# Patient Record
Sex: Female | Born: 1970 | Race: White | Hispanic: No | Marital: Married | Smoking: Never smoker
Health system: Southern US, Community
[De-identification: ages and names within clinical notes are randomized; demographics above are authoritative.]

## PROBLEM LIST (undated history)

## (undated) DIAGNOSIS — G35D Multiple sclerosis, unspecified: Secondary | ICD-10-CM

## (undated) DIAGNOSIS — F32A Depression, unspecified: Secondary | ICD-10-CM

## (undated) DIAGNOSIS — F419 Anxiety disorder, unspecified: Secondary | ICD-10-CM

## (undated) DIAGNOSIS — I4891 Unspecified atrial fibrillation: Secondary | ICD-10-CM

## (undated) HISTORY — DX: Anxiety disorder, unspecified: F41.9

## (undated) HISTORY — DX: Depression, unspecified: F32.A

---

## 2020-07-23 DIAGNOSIS — I4891 Unspecified atrial fibrillation: Secondary | ICD-10-CM | POA: Insufficient documentation

## 2020-07-23 DIAGNOSIS — F419 Anxiety disorder, unspecified: Secondary | ICD-10-CM | POA: Insufficient documentation

## 2020-07-23 LAB — HM HIV SCREENING LAB: HM HIV Screening: NEGATIVE

## 2020-07-23 LAB — HM HEPATITIS C SCREENING LAB: HM Hepatitis Screen: NEGATIVE

## 2020-07-24 DIAGNOSIS — R7303 Prediabetes: Secondary | ICD-10-CM | POA: Insufficient documentation

## 2022-02-20 ENCOUNTER — Ambulatory Visit
Admission: EM | Admit: 2022-02-20 | Discharge: 2022-02-20 | Disposition: A | Discharge status: Home / Self Care | Payer: BC Managed Care – PPO

## 2022-02-20 ENCOUNTER — Ambulatory Visit (INDEPENDENT_AMBULATORY_CARE_PROVIDER_SITE_OTHER): Payer: BC Managed Care – PPO

## 2022-02-20 DIAGNOSIS — R509 Fever, unspecified: Secondary | ICD-10-CM

## 2022-02-20 DIAGNOSIS — R0989 Other specified symptoms and signs involving the circulatory and respiratory systems: Secondary | ICD-10-CM | POA: Diagnosis not present

## 2022-02-20 DIAGNOSIS — J22 Unspecified acute lower respiratory infection: Secondary | ICD-10-CM | POA: Diagnosis not present

## 2022-02-20 DIAGNOSIS — R059 Cough, unspecified: Secondary | ICD-10-CM | POA: Diagnosis not present

## 2022-02-20 MED ORDER — AMOXICILLIN-POT CLAVULANATE 875-125 MG PO TABS: 1.0000 | ORAL_TABLET | Freq: Two times a day (BID) | ORAL | 0 refills | Status: AC

## 2022-02-20 MED ORDER — IBUPROFEN 400 MG PO TABS: 400.0000 mg | ORAL_TABLET | Freq: Three times a day (TID) | ORAL | 0 refills | Status: DC | PRN

## 2022-02-20 MED ORDER — ALBUTEROL SULFATE HFA 108 (90 BASE) MCG/ACT IN AERS: 2.0000 | INHALATION_SPRAY | Freq: Four times a day (QID) | RESPIRATORY_TRACT | 0 refills | Status: DC | PRN

## 2022-02-20 MED ORDER — FLUTICASONE PROPIONATE 50 MCG/ACT NA SUSP: 1.0000 | Freq: Every day | NASAL | 2 refills | Status: DC

## 2022-02-20 MED ORDER — CETIRIZINE HCL 10 MG PO TABS: 10.0000 mg | ORAL_TABLET | Freq: Every day | ORAL | 2 refills | Status: DC

## 2022-02-20 NOTE — ED Triage Notes (Signed)
Pt c/o cough, post nasal drip, ears feel clogged, and nasal congestion (started today).   Started: Sunday  Home interventions: Mucinex, cough drops

## 2022-02-20 NOTE — ED Provider Notes (Signed)
[facility]    CSN: [id number] Arrival date & time: 02/20/22  1751    HISTORY   Chief Complaint  Patient presents with   Cough   Nasal Congestion   Ear Fullness   HPI Erika Mercer is a 51 y.o. female. Patient presents to urgent care today complaining of cough, postnasal drip, sensation of ears feeling clogged and nasal congestion.  Patient states nasal congestion began today but her other symptoms began on Sunday.  Patient states she tried Mucinex and cough drops with no relief of symptoms.  Patient states cough is nonproductive, worse at night.  Patient denies sore throat, body aches, chills, headache nausea, vomiting, diarrhea.  Patient has slightly elevated temperature on arrival, otherwise vital signs are normal.  Patient reports history of bronchitis and pneumonia in the past.  Patient denies history of allergies.  The history is provided by the patient.   History reviewed. No pertinent past medical history. There are no problems to display for this patient.  History reviewed. No pertinent surgical history. OB History   No obstetric history on file.    Home Medications    Prior to Admission medications   Medication Sig Start Date End Date Taking? Authorizing Provider  buPROPion (WELLBUTRIN XL) 300 MG 24 hr tablet Take 300 mg by mouth every morning. 01/27/22   [provider]  escitalopram (LEXAPRO) 20 MG tablet Take 20 mg by mouth every morning. 01/27/22   [provider]   Family History History reviewed. No pertinent family history. Social History Social History   Tobacco Use   Smoking status: Never   Smokeless tobacco: Never  Vaping Use   Vaping Use: Never used  Substance Use Topics   Alcohol use: Never   Drug use: Never   Allergies   Promethazine  Review of Systems Review of Systems Pertinent findings noted in history of present illness.   Physical Exam Triage Vital Signs ED Triage Vitals  Enc Vitals Group     BP  07/04/21 0827 (!) 147/82     Pulse Rate 07/04/21 0827 72     Resp 07/04/21 0827 18     Temp 07/04/21 0827 98.3 F (36.8 C)     Temp Source 07/04/21 0827 Oral     SpO2 07/04/21 0827 98 %     Weight --      Height --      Head Circumference --      Peak Flow --      Pain Score 07/04/21 0826 5     Pain Loc --      Pain Edu? --      Excl. in GC? --   No data found.  Updated Vital Signs BP 122/80 (BP Location: Right Arm)   Pulse 86   Temp 100.3 F (37.9 C) (Oral)   Resp 18   SpO2 95%   Physical Exam Vitals and nursing note reviewed.  Constitutional:      General: She is not in acute distress.    Appearance: Normal appearance. She is not ill-appearing.  HENT:     Head: Normocephalic and atraumatic.     Salivary Glands: Right salivary gland is not diffusely enlarged or tender. Left salivary gland is not diffusely enlarged or tender.     Right Ear: Tympanic membrane, ear canal and external ear normal. No drainage. No middle ear effusion. There is no impacted cerumen. Tympanic membrane is not erythematous or bulging.     Left Ear: Tympanic membrane, ear  canal and external ear normal. No drainage.  No middle ear effusion. There is no impacted cerumen. Tympanic membrane is not erythematous or bulging.     Nose: Nose normal. No nasal deformity, septal deviation, mucosal edema, congestion or rhinorrhea.     Right Turbinates: Not enlarged, swollen or pale.     Left Turbinates: Not enlarged, swollen or pale.     Right Sinus: No maxillary sinus tenderness or frontal sinus tenderness.     Left Sinus: No maxillary sinus tenderness or frontal sinus tenderness.     Mouth/Throat:     Lips: Pink. No lesions.     Mouth: Mucous membranes are moist. No oral lesions.     Pharynx: Oropharynx is clear. Uvula midline. No posterior oropharyngeal erythema or uvula swelling.     Tonsils: No tonsillar exudate. 0 on the right. 0 on the left.  Eyes:     General: Lids are normal.        Right eye: No  discharge.        Left eye: No discharge.     Extraocular Movements: Extraocular movements intact.     Conjunctiva/sclera: Conjunctivae normal.     Right eye: Right conjunctiva is not injected.     Left eye: Left conjunctiva is not injected.  Neck:     Trachea: Trachea and phonation normal.  Cardiovascular:     Rate and Rhythm: Normal rate and regular rhythm.     Pulses: Normal pulses.     Heart sounds: Normal heart sounds. No murmur heard.    No friction rub. No gallop.  Pulmonary:     Effort: Pulmonary effort is normal. No accessory muscle usage, prolonged expiration or respiratory distress.     Breath sounds: No stridor, decreased air movement or transmitted upper airway sounds. Examination of the left-lower field reveals rales. Rales present. No decreased breath sounds, wheezing or rhonchi.  Chest:     Chest wall: No tenderness.  Musculoskeletal:        General: Normal range of motion.     Cervical back: Normal range of motion and neck supple. Normal range of motion.  Lymphadenopathy:     Cervical: No cervical adenopathy.  Skin:    General: Skin is warm and dry.     Findings: No erythema or rash.  Neurological:     General: No focal deficit present.     Mental Status: She is alert and oriented to person, place, and time.  Psychiatric:        Mood and Affect: Mood normal.        Behavior: Behavior normal.     Visual Acuity Right Eye Distance:   Left Eye Distance:   Bilateral Distance:    Right Eye Near:   Left Eye Near:    Bilateral Near:     [facility] Couse / Diagnostics / Procedures:    EKG  Radiology DG Chest 2 View  Result Date: 02/20/2022 CLINICAL DATA:  Rales in left lower lobe, fever, cough EXAM: CHEST - 2 VIEW COMPARISON:  None Available. FINDINGS: The cardiomediastinal silhouette is normal. There is no focal consolidation or pulmonary edema. There is no pleural effusion or pneumothorax. There is no acute osseous abnormality. IMPRESSION: No radiographic evidence  of acute cardiopulmonary process. Electronically Signed   By: Lesia Hausen M.D.   On: 02/20/2022 08:41    Procedures Procedures (including critical care time)  UC Diagnoses / Final Clinical Impressions(s)   I have reviewed the triage vital signs and the  nursing notes.  Pertinent labs & imaging results that were available during my care of the patient were reviewed by me and considered in my medical decision making (see chart for details).   Final diagnoses:  Acute lower respiratory infection   Per radiology report, chest x-ray is negative.  Based on my physical exam findings and my personal read, believe the patient is showing some early signs of pneumonia and she be treated empirically as such.  Patient provided with a prescription for Augmentin, albuterol, Zyrtec and Flonase as well as NSAIDs.  Return precautions advised.  ED Prescriptions     Medication Sig Dispense Auth. Provider   amoxicillin-clavulanate (AUGMENTIN) 875-125 MG tablet Take 1 tablet by mouth 2 (two) times daily for 5 days. 10 tablet Theadora RamaMorgan, Reynalda Canny Scales, PA-C   cetirizine (ZYRTEC ALLERGY) 10 MG tablet Take 1 tablet (10 mg total) by mouth at bedtime. 30 tablet Theadora RamaMorgan, Ugochi Henzler Scales, PA-C   ibuprofen (ADVIL) 400 MG tablet Take 1 tablet (400 mg total) by mouth every 8 (eight) hours as needed for up to 30 doses. 30 tablet Theadora RamaMorgan, Raphael Espe Scales, PA-C   fluticasone (FLONASE) 50 MCG/ACT nasal spray Place 1 spray into both nostrils daily. Begin by using 2 sprays in each nare daily for 3 to 5 days, then decrease to 1 spray in each nare daily. 15.8 mL Theadora RamaMorgan, Vannessa Godown Scales, PA-C   albuterol (VENTOLIN HFA) 108 (90 Base) MCG/ACT inhaler Inhale 2 puffs into the lungs every 6 (six) hours as needed for wheezing or shortness of breath (Cough). 18 g Theadora RamaMorgan, Domenick Quebedeaux Scales, PA-C      PDMP not reviewed this encounter.  Pending results:  Labs Reviewed - No data to display  Medications Ordered in UC: Medications - No data to  display  Disposition Upon Discharge:  Condition: stable for discharge home Home: take medications as prescribed; routine discharge instructions as discussed; follow up as advised.  Patient presented with an acute illness with associated systemic symptoms and significant discomfort requiring urgent management. In my opinion, this is a condition that a prudent lay person (someone who possesses an average knowledge of health and medicine) may potentially expect to result in complications if not addressed urgently such as respiratory distress, impairment of bodily function or dysfunction of bodily organs.   Routine symptom specific, illness specific and/or disease specific instructions were discussed with the patient and/or caregiver at length.   As such, the patient has been evaluated and assessed, work-up was performed and treatment was provided in alignment with urgent care protocols and evidence based medicine.  Patient/parent/caregiver has been advised that the patient may require follow up for further testing and treatment if the symptoms continue in spite of treatment, as clinically indicated and appropriate.  If the patient was tested for COVID-19, Influenza and/or RSV, then the patient/parent/guardian was advised to isolate at home pending the results of his/her diagnostic coronavirus test and potentially longer if they're positive. I have also advised pt that if his/her COVID-19 test returns positive, it's recommended to self-isolate for at least 10 days after symptoms first appeared AND until fever-free for 24 hours without fever reducer AND other symptoms have improved or resolved. Discussed self-isolation recommendations as well as instructions for household member/close contacts as per the [facility] and [place] [facility], and also gave patient the COVID packet with this information.  Patient/parent/caregiver has been advised [facility] in 3-5 days if no better; to PCP or the Emergency  Department if new signs and  symptoms develop, or if the current signs or symptoms continue to change or worsen for further workup, evaluation and treatment as clinically indicated and appropriate  The patient will follow up with their current PCP if and as advised. If the patient does not currently have a PCP we will assist them in obtaining one.   The patient may need specialty follow up if the symptoms continue, in spite of conservative treatment and management, for further workup, evaluation, consultation and treatment as clinically indicated and appropriate.  Patient/parent/caregiver verbalized understanding and agreement of plan as discussed.  All questions were addressed during visit.  Please see discharge instructions below for further details of plan.  Discharge Instructions:   Discharge Instructions      Based on the information you provided to me, your past medical history, your x-ray and my physical exam findings, I recommend that you begin antibiotics to treat you for possible pneumonia in your left lower lobe.  Please make sure to follow-up with either your primary care provider or return here to urgent care for repeat evaluation in 3 to 4 days just to ensure that you are improving.  Please go to the emergency room if you are not.  Please see the list below for recommended medications, dosages and frequencies to provide relief of your current symptoms:    Augmentin (amoxicillin - clavulanic acid):  take 1 tablet twice daily for 5 days, you can take it with or without food.  This antibiotic can cause upset stomach, this will resolve once antibiotics are complete.  You are welcome to use a probiotic, eat yogurt, take Imodium while taking this medication.  Please avoid other systemic medications such as Maalox, Pepto-Bismol or milk of magnesia as they can interfere with your body's ability to absorb the antibiotics.   Advil, Motrin (ibuprofen): This is a good anti-inflammatory  medication which not only addresses aches, pains but also significantly reduces soft tissue inflammation of the upper airways that causes sinus and nasal congestion as well as inflammation of the lower airways which makes you feel like your breathing is constricted or your cough feel tight.  I recommend that you take between 400 mg every 8 hours as needed.      Zyrtec (cetirizine): This is an excellent second-generation antihistamine that helps to reduce respiratory inflammatory response to environmental allergens.  In some patients, this medication can cause daytime sleepiness so I recommend that you take 1 tablet daily at bedtime.     Flonase (fluticasone): This is a steroid nasal spray that you use once daily, 1 spray in each nare.  This medication does not work well if you decide to use it only used as you feel you need to, it works best used on a daily basis.  After 3 to 5 days of use, you will notice significant reduction of the inflammation and mucus production that is currently being caused by exposure to allergens, whether seasonal or environmental.  The most common side effect of this medication is nosebleeds.  If you experience a nosebleed, please discontinue use for 1 week, then feel free to resume.  I have provided you with a prescription.    ProAir, Ventolin, Proventil (albuterol): This inhaled medication contains a short acting beta agonist bronchodilator.  This medication works on the smooth muscle that opens and constricts of your airways by relaxing the muscle.  The result of relaxation of the smooth muscle is increased air movement and improved work of breathing.  This is a short  acting medication that can be used every 4-6 hours as needed for increased work of breathing, shortness of breath, wheezing and excessive coughing.  I have provided you with a prescription for albuterol but this is certainly optional.  I recommend that you give the antibiotics a few days work their magic and if you  still feel like the cough is persistent or is turning into more of a bronchitis, please begin inhaling 2 puffs of albuterol 4 times daily.  Please follow-up within the next 5-7 days either with your primary care provider or urgent care if your symptoms do not resolve.  If you do not have a primary care provider, we will assist you in finding one.   Thank you for visiting urgent care today.  We appreciate the opportunity to participate in your care.       This office note has been dictated using Teaching laboratory technician.  Unfortunately, and despite my best efforts, this method of dictation can sometimes lead to occasional typographical or grammatical errors.  I apologize in advance if this occurs.     Theadora Rama Scales, [place] 02/20/22 [phone]

## 2022-02-20 NOTE — Discharge Instructions (Addendum)
Based on the information you provided to me, your past medical history, your x-ray and my physical exam findings, I recommend that you begin antibiotics to treat you for possible pneumonia in your left lower lobe.  Please make sure to follow-up with either your primary care provider or return here to urgent care for repeat evaluation in 3 to 4 days just to ensure that you are improving.  Please go to the emergency room if you are not.  Please see the list below for recommended medications, dosages and frequencies to provide relief of your current symptoms:    Augmentin (amoxicillin - clavulanic acid):  take 1 tablet twice daily for 5 days, you can take it with or without food.  This antibiotic can cause upset stomach, this will resolve once antibiotics are complete.  You are welcome to use a probiotic, eat yogurt, take Imodium while taking this medication.  Please avoid other systemic medications such as Maalox, Pepto-Bismol or milk of magnesia as they can interfere with your body's ability to absorb the antibiotics.   Advil, Motrin (ibuprofen): This is a good anti-inflammatory medication which not only addresses aches, pains but also significantly reduces soft tissue inflammation of the upper airways that causes sinus and nasal congestion as well as inflammation of the lower airways which makes you feel like your breathing is constricted or your cough feel tight.  I recommend that you take between 400 mg every 8 hours as needed.      Zyrtec (cetirizine): This is an excellent second-generation antihistamine that helps to reduce respiratory inflammatory response to environmental allergens.  In some patients, this medication can cause daytime sleepiness so I recommend that you take 1 tablet daily at bedtime.     Flonase (fluticasone): This is a steroid nasal spray that you use once daily, 1 spray in each nare.  This medication does not work well if you decide to use it only used as you feel you need to, it  works best used on a daily basis.  After 3 to 5 days of use, you will notice significant reduction of the inflammation and mucus production that is currently being caused by exposure to allergens, whether seasonal or environmental.  The most common side effect of this medication is nosebleeds.  If you experience a nosebleed, please discontinue use for 1 week, then feel free to resume.  I have provided you with a prescription.    ProAir, Ventolin, Proventil (albuterol): This inhaled medication contains a short acting beta agonist bronchodilator.  This medication works on the smooth muscle that opens and constricts of your airways by relaxing the muscle.  The result of relaxation of the smooth muscle is increased air movement and improved work of breathing.  This is a short acting medication that can be used every 4-6 hours as needed for increased work of breathing, shortness of breath, wheezing and excessive coughing.  I have provided you with a prescription for albuterol but this is certainly optional.  I recommend that you give the antibiotics a few days work their magic and if you still feel like the cough is persistent or is turning into more of a bronchitis, please begin inhaling 2 puffs of albuterol 4 times daily.  Please follow-up within the next 5-7 days either with your primary care provider or urgent care if your symptoms do not resolve.  If you do not have a primary care provider, we will assist you in finding one.   Thank you for visiting urgent  care today.  We appreciate the opportunity to participate in your care.

## 2023-04-07 ENCOUNTER — Ambulatory Visit
Admission: EM | Admit: 2023-04-07 | Discharge: 2023-04-07 | Disposition: A | Discharge status: Home / Self Care | Payer: No Typology Code available for payment source | Attending: Physician Assistant | Admitting: Physician Assistant

## 2023-04-07 DIAGNOSIS — L03012 Cellulitis of left finger: Secondary | ICD-10-CM | POA: Diagnosis not present

## 2023-04-07 DIAGNOSIS — S01301A Unspecified open wound of right ear, initial encounter: Secondary | ICD-10-CM

## 2023-04-07 MED ORDER — AMOXICILLIN-POT CLAVULANATE 875-125 MG PO TABS: 1.0000 | ORAL_TABLET | Freq: Two times a day (BID) | ORAL | 0 refills | Status: DC

## 2023-04-07 MED ORDER — MUPIROCIN 2 % EX OINT: 1.0000 | TOPICAL_OINTMENT | Freq: Two times a day (BID) | CUTANEOUS | 0 refills | Status: DC

## 2023-04-07 NOTE — ED Provider Notes (Signed)
[facility]    CSN: [id number] Arrival date & time: 04/07/23  0825      History   Chief Complaint No chief complaint on file.   HPI Erika Mercer is a 53 y.o. female.   Patient presents today with several concerns.  Her primary concern today is a 3-day history of swelling and discomfort along the nail edge of her left index finger.  She denies any known injury but does report that she occasionally bites her nails and cuticles.  She has not tried any over-the-counter medication or warm soaks for symptom management.  Reports discomfort is rated 3 on a 0-10 pain scale, described as throbbing, no aggravating or alleviating factors identified.  She denies any recent antibiotics.  Denies history of diabetes.  Denies any fever, nausea, vomiting, numbness or paresthesias in the finger.  She is slightly concerned that there might be a splinter in that area just because there is a small darkened area at the nail edge.  She denies any known trauma to this area.  In addition she reports a several month history of painful lesion on the edge of her right external ear.  She denies any known trauma to this area.  Does report that she has had it with a hair pressure a few times this causes significant pain.  She has not seen ENT.      History reviewed. No pertinent past medical history.  There are no problems to display for this patient.   History reviewed. No pertinent surgical history.  OB History   No obstetric history on file.      Home Medications    Prior to Admission medications   Medication Sig Start Date End Date Taking? Authorizing Provider  amoxicillin-clavulanate (AUGMENTIN) 875-125 MG tablet Take 1 tablet by mouth every 12 (twelve) hours. 04/07/23  Yes Nyjae Hodge, Denny Peon K, PA-C  mupirocin ointment (BACTROBAN) 2 % Apply 1 Application topically 2 (two) times daily. 04/07/23  Yes Kyasia Steuck K, PA-C  albuterol (VENTOLIN HFA) 108 (90 Base) MCG/ACT inhaler Inhale 2 puffs into the  lungs every 6 (six) hours as needed for wheezing or shortness of breath (Cough). 02/20/22   Theadora Rama Scales, PA-C  cetirizine (ZYRTEC ALLERGY) 10 MG tablet Take 1 tablet (10 mg total) by mouth at bedtime. 02/20/22 05/21/22  Theadora Rama Scales, PA-C  fluticasone (FLONASE) 50 MCG/ACT nasal spray Place 1 spray into both nostrils daily. Begin by using 2 sprays in each nare daily for 3 to 5 days, then decrease to 1 spray in each nare daily. 02/20/22   Theadora Rama Scales, PA-C  ibuprofen (ADVIL) 400 MG tablet Take 1 tablet (400 mg total) by mouth every 8 (eight) hours as needed for up to 30 doses. 02/20/22   Theadora Rama Scales, PA-C    Family History History reviewed. No pertinent family history.  Social History Social History   Tobacco Use   Smoking status: Never   Smokeless tobacco: Never  Vaping Use   Vaping status: Never Used  Substance Use Topics   Alcohol use: Never   Drug use: Never     Allergies   Promethazine   Review of Systems Review of Systems  Constitutional:  Positive for activity change. Negative for appetite change, fatigue and fever.  HENT:  Positive for ear pain (external). Negative for congestion and sore throat.   Cardiovascular:  Negative for chest pain.  Gastrointestinal:  Negative for abdominal pain, diarrhea, nausea and vomiting.  Musculoskeletal:  Negative for arthralgias and  myalgias.  Skin:  Positive for color change. Negative for wound.     Physical Exam Triage Vital Signs ED Triage Vitals  Encounter Vitals Group     BP 04/07/23 0839 113/71     Systolic BP Percentile --      Diastolic BP Percentile --      Pulse Rate 04/07/23 0839 77     Resp 04/07/23 0839 16     Temp 04/07/23 0839 98.6 F (37 C)     Temp Source 04/07/23 0839 Oral     SpO2 04/07/23 0839 95 %     Weight --      Height --      Head Circumference --      Peak Flow --      Pain Score 04/07/23 0843 4     Pain Loc --      Pain Education --      Exclude from  Growth Chart --    No data found.  Updated Vital Signs BP 113/71 (BP Location: Right Arm)   Pulse 77   Temp 98.6 F (37 C) (Oral)   Resp 16   SpO2 95%   Visual Acuity Right Eye Distance:   Left Eye Distance:   Bilateral Distance:    Right Eye Near:   Left Eye Near:    Bilateral Near:     Physical Exam Vitals reviewed.  Constitutional:      General: She is awake. She is not in acute distress.    Appearance: Normal appearance. She is well-developed. She is not ill-appearing.     Comments: Very pleasant female appears that age in no acute distress sitting comfortably in exam room  HENT:     Head: Normocephalic and atraumatic.     Ears:   Cardiovascular:     Rate and Rhythm: Normal rate and regular rhythm.     Heart sounds: Normal heart sounds, S1 normal and S2 normal. No murmur heard.    Comments: Capillary fill within 2 seconds left fingers Pulmonary:     Effort: Pulmonary effort is normal.     Breath sounds: Normal breath sounds. No wheezing, rhonchi or rales.     Comments: Clear to auscultation bilaterally Skin:    Comments: Erythema noted lateral nail fold with radiation along proximal nail fold.  No evidence of purulence or drainable fluid collection.  Small hyperpigmented area noted middle portion of lateral nail fold.  Psychiatric:        Behavior: Behavior is cooperative.      UC Treatments / Results  Labs (all labs ordered are listed, but only abnormal results are displayed) Labs Reviewed - No data to display  EKG   Radiology No results found.  Procedures Procedures (including critical care time)  Medications Ordered in UC Medications - No data to display  Initial Impression / Assessment and Plan / UC Course  I have reviewed the triage vital signs and the nursing notes.  Pertinent labs & imaging results that were available during my care of the patient were reviewed by me and considered in my medical decision making (see chart for details).      Patient is well-appearing, afebrile, nontoxic, nontachycardic.  Paronychia noted on physical exam.  There was a small hyperpigmented area and so this area was cleaned with alcohol and then numbed with topical pain ease after which an 18-gauge needle was used to remove this small area.  This appeared to be scabbing as there is no evidence of  retained foreign body such as splinter.  A small amount of purulence was noted at the site but unable to express additional drainage.  Patient was encouraged to use Bactroban ointment with dressing changes and soak her finger in warm Epsom salt water a few times per day.  Will start Augmentin twice daily.  We discussed that if anything worsens and she has spread of erythema, pain, fever, numbness or tingling she should be reevaluated immediately.  Strict return precautions given.  Patient declined work excuse note.  No evidence of perichondritis on exam.  This appears to be a small wound.  We discussed that she can use the Bactroban ointment on this area as well and should avoid any future trauma including hitting it with her hairbrush during grooming activities.  Discussed that antibiotic should also cover for typical infection.  If the symptoms are not improving with conservative treatment measures she is to follow-up with ENT and was given contact information for local provider.  We discussed that if anything worsens and she has increasing pain, redness swelling throughout her ear, fever, nausea, vomiting she needs to be seen immediately.  Patient does not have a current primary care and was interested in establishing with someone in our system.  Will try to establish her with someone via PCP assistance.  Final Clinical Impressions(s) / UC Diagnoses   Final diagnoses:  Paronychia of left index finger  Open wound of right ear, unspecified open wound type, initial encounter     Discharge Instructions      Keep area clean with soap and water.  Apply Bactroban  ointment to your finger.  Use warm salt water soaks a few times per day.  Take Tylenol or Profen for pain.  Start Augmentin twice daily.  If this is not improving or if anything worsens please return for reevaluation.  Keep the area of your ear clean.  Apply Bactroban ointment twice daily.  Try to avoid hitting this with your Algis Liming or any additional trauma.  Follow-up with ENT if symptoms or not improving; call to schedule appointment.  If anything worsens and you have increasing pain, spread of lesion, additional redness or swelling in your ear you need to be seen immediately.  Someone should contact you to schedule an appoint with primary care.     ED Prescriptions     Medication Sig Dispense Auth. Provider   mupirocin ointment (BACTROBAN) 2 % Apply 1 Application topically 2 (two) times daily. 22 g Carine Nordgren K, PA-C   amoxicillin-clavulanate (AUGMENTIN) 875-125 MG tablet Take 1 tablet by mouth every 12 (twelve) hours. 14 tablet Terrall Bley, Noberto Retort, PA-C      PDMP not reviewed this encounter.   Jeani Hawking, PA-C 04/07/23 [id number]

## 2023-04-07 NOTE — ED Triage Notes (Signed)
Pt presents to UC w/ c/o left hand pointer finger pain and swelling x3 days.  Pain right ear pain at helix x4-5 months

## 2023-04-07 NOTE — Discharge Instructions (Signed)
Keep area clean with soap and water.  Apply Bactroban ointment to your finger.  Use warm salt water soaks a few times per day.  Take Tylenol or Profen for pain.  Start Augmentin twice daily.  If this is not improving or if anything worsens please return for reevaluation.  Keep the area of your ear clean.  Apply Bactroban ointment twice daily.  Try to avoid hitting this with your Algis Liming or any additional trauma.  Follow-up with ENT if symptoms or not improving; call to schedule appointment.  If anything worsens and you have increasing pain, spread of lesion, additional redness or swelling in your ear you need to be seen immediately.  Someone should contact you to schedule an appoint with primary care.

## 2023-04-23 ENCOUNTER — Encounter (HOSPITAL_COMMUNITY): Payer: Self-pay

## 2023-05-13 ENCOUNTER — Ambulatory Visit: Payer: No Typology Code available for payment source | Admitting: Nurse Practitioner

## 2023-05-13 ENCOUNTER — Encounter: Payer: Self-pay | Admitting: Nurse Practitioner

## 2023-05-13 VITALS — BP 124/80 | HR 76 | Temp 97.6°F | Ht 66.0 in | Wt 277.0 lb

## 2023-05-13 DIAGNOSIS — F419 Anxiety disorder, unspecified: Secondary | ICD-10-CM

## 2023-05-13 DIAGNOSIS — F32A Depression, unspecified: Secondary | ICD-10-CM | POA: Diagnosis not present

## 2023-05-13 DIAGNOSIS — Z6841 Body Mass Index (BMI) 40.0 and over, adult: Secondary | ICD-10-CM | POA: Diagnosis not present

## 2023-05-13 DIAGNOSIS — Z1211 Encounter for screening for malignant neoplasm of colon: Secondary | ICD-10-CM

## 2023-05-13 DIAGNOSIS — Z1231 Encounter for screening mammogram for malignant neoplasm of breast: Secondary | ICD-10-CM

## 2023-05-13 MED ORDER — ESCITALOPRAM OXALATE 20 MG PO TABS: 20.0000 mg | ORAL_TABLET | Freq: Every day | ORAL | Status: DC

## 2023-05-13 NOTE — Assessment & Plan Note (Signed)
Chronic, controlled.  She was taking Lexapro 20 mg daily and was doing better so she stopped taking it.  She feels like her depression anxiety have gotten slightly worse again.  She denies SI/HI.  Her PHQ-9 is a 5 and her GAD-7 is a 2.  Will have her restart Lexapro 10 mg daily for 2 weeks and then increase to 20 mg daily.  Follow-up in 2 months.

## 2023-05-13 NOTE — Patient Instructions (Addendum)
It was great to see you!  Re-start your lexapro 1/2 tablet daily for 2 weeks then 1 tablet.   Focus on small goals and habits for your weight loss.   You are scheduled for a mammogram at our office on 07/07/23 at 8:30am.   Let's follow-up in 2 months, sooner if you have concerns.  If a referral was placed today, you will be contacted for an appointment. Please note that routine referrals can sometimes take up to 3-4 weeks to process. Please call our office if you haven't heard anything after this time frame.  Take care,  Rodman Pickle, NP

## 2023-05-13 NOTE — Assessment & Plan Note (Signed)
BMI 44.7.  She states that she has been trying to do Nutrisystem, however has been snacking as well.  Discussed trying to limit snacking with doing another activity and trying to go to bed earlier.  She can also start walking even for just 5 minutes daily.  Discussed starting just one new habit at a time.  Follow-up in 2 months

## 2023-05-13 NOTE — Progress Notes (Signed)
New Patient Visit  BP 124/80 (BP Location: Left Arm)   Pulse 76   Temp 97.6 F (36.4 C)   Ht 5\' 6"  (1.676 m)   Wt 277 lb (125.6 kg)   SpO2 97%   BMI 44.71 kg/m    Subjective:    Patient ID: Erika Mercer, female    DOB: [birth year], 52 y.o.   MRN: [id number]  CC: Chief Complaint  Patient presents with   Establish Care    NP. Est. Are, no concerns    HPI: Erika Mercer is a 52 y.o. female presents for new patient visit to establish care.  Introduced to Publishing rights manager role and practice setting.  All questions answered.  Discussed provider/patient relationship and expectations.  She has a history of depression and anxiety.  [facility] and [facility]ro 20 mg daily.  She felt like her symptoms were getting better and so she weaned herself off.  She has noticed that however that her depression and anxiety have slightly worsened again since being off the medication.  She would like to restart the Lexapro.  She has been struggling with her weight for the past several years.  She states that 280 pounds at her heaviest.  She has been doing Nutrisystem and not losing weight.  She states that she saw a nutritionist who told her she should be eating between 1400 and 1600 calories which is what her Nutrisystem's.  She states however that she has been snacking in between meals and at night.  She states that she does not exercise at this time.     05/13/2023    9:08 AM  Depression screen PHQ 2/9  Decreased Interest 0  Down, Depressed, Hopeless 1  PHQ - 2 Score 1  Altered sleeping 0  Tired, decreased energy 2  Change in appetite 2  Feeling bad or failure about yourself  0  Trouble concentrating 0  Moving slowly or fidgety/restless 0  Suicidal thoughts 0  PHQ-9 Score 5  Difficult doing work/chores Not difficult at all      05/13/2023    9:08 AM  GAD 7 : Generalized Anxiety Score  Nervous, Anxious, on Edge 1  Control/stop worrying 0  Worry too much  - different things 1  Trouble relaxing 0  Restless 0  Easily annoyed or irritable 0  Afraid - awful might happen 0  Total GAD 7 Score 2  Anxiety Difficulty Not difficult at all    Past Medical History:  Diagnosis Date   Anxiety    Depression     Past Surgical History:  Procedure Laterality Date   CESAREAN SECTION  03/03/1995   x2    Family History  Problem Relation Age of Onset   Cancer Father        prostate, leukemia   Diabetes Father    Intellectual disability Sister        down syndrome   Asthma Brother    ADD / ADHD Son    ADD / ADHD Maternal Uncle    Cancer Maternal Grandmother        breast     Social History   Tobacco Use   Smoking status: Never   Smokeless tobacco: Never  Vaping Use   Vaping status: Never Used  Substance Use Topics   Alcohol use: Not Currently   Drug use: Never    No current outpatient medications on file prior to visit.   No current facility-administered medications on file  prior to visit.     Review of Systems  Constitutional: Negative.   HENT: Negative.    Eyes: Negative.   Respiratory: Negative.    Cardiovascular: Negative.   Gastrointestinal: Negative.   Genitourinary: Negative.   Musculoskeletal:  Positive for arthralgias (left hip when laying on it).  Skin: Negative.   Neurological: Negative.   Psychiatric/Behavioral:  The patient is nervous/anxious.       Objective:    BP 124/80 (BP Location: Left Arm)   Pulse 76   Temp 97.6 F (36.4 C)   Ht 5\' 6"  (1.676 m)   Wt 277 lb (125.6 kg)   SpO2 97%   BMI 44.71 kg/m   Wt Readings from Last 3 Encounters:  05/13/23 277 lb (125.6 kg)    BP Readings from Last 3 Encounters:  05/13/23 124/80  04/07/23 113/71  02/20/22 122/80    Physical Exam Vitals and nursing note reviewed.  Constitutional:      General: She is not in acute distress.    Appearance: Normal appearance.  HENT:     Head: Normocephalic and atraumatic.     Right Ear: Tympanic membrane, ear canal  and external ear normal.     Left Ear: Tympanic membrane, ear canal and external ear normal.  Eyes:     Conjunctiva/sclera: Conjunctivae normal.  Cardiovascular:     Rate and Rhythm: Normal rate and regular rhythm.     Pulses: Normal pulses.     Heart sounds: Normal heart sounds.  Pulmonary:     Effort: Pulmonary effort is normal.     Breath sounds: Normal breath sounds.  Abdominal:     Palpations: Abdomen is soft.     Tenderness: There is no abdominal tenderness.  Musculoskeletal:        General: Normal range of motion.     Cervical back: Normal range of motion and neck supple.     Right lower leg: No edema.     Left lower leg: No edema.  Lymphadenopathy:     Cervical: No cervical adenopathy.  Skin:    General: Skin is warm and dry.  Neurological:     General: No focal deficit present.     Mental Status: She is alert and oriented to person, place, and time.     Cranial Nerves: No cranial nerve deficit.     Coordination: Coordination normal.     Gait: Gait normal.  Psychiatric:        Mood and Affect: Mood normal.        Behavior: Behavior normal.        Thought Content: Thought content normal.        Judgment: Judgment normal.        Assessment & Plan:   Problem List Items Addressed This Visit       Other   Anxiety and depression - Primary    Chronic, controlled.  She was taking Lexapro 20 mg daily and was doing better so she stopped taking it.  She feels like her depression anxiety have gotten slightly worse again.  She denies SI/HI.  Her PHQ-9 is a 5 and her GAD-7 is a 2.  Will have her restart Lexapro 10 mg daily for 2 weeks and then increase to 20 mg daily.  Follow-up in 2 months.      Relevant Medications   escitalopram (LEXAPRO) 20 MG tablet   Morbid obesity (HCC)    BMI 44.7.  She states that she has been trying to do  Nutrisystem, however has been snacking as well.  Discussed trying to limit snacking with doing another activity and trying to go to bed  earlier.  She can also start walking even for just 5 minutes daily.  Discussed starting just one new habit at a time.  Follow-up in 2 months      Other Visit Diagnoses     Screen for colon cancer       Cologuard ordered today   Relevant Orders   Cologuard   Encounter for screening mammogram for malignant neoplasm of breast       Scheduled for mammogram on 07/07/23 at 8:30am   Relevant Orders   MM 3D SCREENING MAMMOGRAM BILATERAL BREAST        Follow up plan: Return in about 2 months (around 07/13/2023) for CPE.  Tondra Reierson A Khloi Rawl

## 2023-07-07 ENCOUNTER — Ambulatory Visit
Admission: RE | Admit: 2023-07-07 | Discharge: 2023-07-07 | Disposition: A | Discharge status: Home / Self Care | Payer: No Typology Code available for payment source | Source: Ambulatory Visit | Attending: Nurse Practitioner | Admitting: Nurse Practitioner

## 2023-07-07 DIAGNOSIS — Z1231 Encounter for screening mammogram for malignant neoplasm of breast: Secondary | ICD-10-CM

## 2023-07-13 ENCOUNTER — Other Ambulatory Visit (HOSPITAL_COMMUNITY)
Admission: RE | Admit: 2023-07-13 | Discharge: 2023-07-13 | Disposition: A | Discharge status: Home / Self Care | Payer: No Typology Code available for payment source | Source: Ambulatory Visit | Attending: Nurse Practitioner | Admitting: Nurse Practitioner

## 2023-07-13 ENCOUNTER — Encounter: Payer: Self-pay | Admitting: Nurse Practitioner

## 2023-07-13 ENCOUNTER — Ambulatory Visit: Payer: No Typology Code available for payment source | Admitting: Nurse Practitioner

## 2023-07-13 VITALS — BP 122/80 | HR 71 | Temp 98.2°F | Ht 66.0 in | Wt 279.8 lb

## 2023-07-13 DIAGNOSIS — Z124 Encounter for screening for malignant neoplasm of cervix: Secondary | ICD-10-CM

## 2023-07-13 DIAGNOSIS — Z0000 Encounter for general adult medical examination without abnormal findings: Secondary | ICD-10-CM | POA: Diagnosis not present

## 2023-07-13 DIAGNOSIS — Z6841 Body Mass Index (BMI) 40.0 and over, adult: Secondary | ICD-10-CM

## 2023-07-13 DIAGNOSIS — R7303 Prediabetes: Secondary | ICD-10-CM | POA: Diagnosis not present

## 2023-07-13 DIAGNOSIS — F419 Anxiety disorder, unspecified: Secondary | ICD-10-CM | POA: Diagnosis not present

## 2023-07-13 DIAGNOSIS — F32A Depression, unspecified: Secondary | ICD-10-CM

## 2023-07-13 LAB — CBC WITH DIFFERENTIAL/PLATELET
Basophils Absolute: 0.1 10*3/uL (ref 0.0–0.1)
Basophils Relative: 1 % (ref 0.0–3.0)
Eosinophils Absolute: 0.1 10*3/uL (ref 0.0–0.7)
Eosinophils Relative: 2.2 % (ref 0.0–5.0)
HCT: 42.7 % (ref 36.0–46.0)
Hemoglobin: 13.6 g/dL (ref 12.0–15.0)
Lymphocytes Relative: 33 % (ref 12.0–46.0)
Lymphs Abs: 1.9 10*3/uL (ref 0.7–4.0)
MCHC: 31.8 g/dL (ref 30.0–36.0)
MCV: 89.9 fL (ref 78.0–100.0)
Monocytes Absolute: 0.4 10*3/uL (ref 0.1–1.0)
Monocytes Relative: 6.6 % (ref 3.0–12.0)
Neutro Abs: 3.4 10*3/uL (ref 1.4–7.7)
Neutrophils Relative %: 57.2 % (ref 43.0–77.0)
Platelets: 318 10*3/uL (ref 150.0–400.0)
RBC: 4.75 Mil/uL (ref 3.87–5.11)
RDW: 14.5 % (ref 11.5–15.5)
WBC: 5.9 10*3/uL (ref 4.0–10.5)

## 2023-07-13 LAB — COMPREHENSIVE METABOLIC PANEL
ALT: 32 U/L (ref 0–35)
AST: 27 U/L (ref 0–37)
Albumin: 4.1 g/dL (ref 3.5–5.2)
Alkaline Phosphatase: 46 U/L (ref 39–117)
BUN: 11 mg/dL (ref 6–23)
CO2: 31 meq/L (ref 19–32)
Calcium: 9.2 mg/dL (ref 8.4–10.5)
Chloride: 104 meq/L (ref 96–112)
Creatinine, Ser: 0.71 mg/dL (ref 0.40–1.20)
GFR: 96.55 mL/min (ref >60.00)
Glucose, Bld: 108 mg/dL — ABNORMAL HIGH (ref 70–99)
Potassium: 4.5 meq/L (ref 3.5–5.1)
Sodium: 141 meq/L (ref 135–145)
Total Bilirubin: 0.4 mg/dL (ref 0.2–1.2)
Total Protein: 6.5 g/dL (ref 6.0–8.3)

## 2023-07-13 LAB — LIPID PANEL
Cholesterol: 138 mg/dL (ref 0–200)
HDL: 54.5 mg/dL (ref >39.00)
LDL Cholesterol: 69 mg/dL (ref 0–99)
NonHDL: 83.99
Total CHOL/HDL Ratio: 3
Triglycerides: 76 mg/dL (ref 0.0–149.0)
VLDL: 15.2 mg/dL (ref 0.0–40.0)

## 2023-07-13 LAB — HEMOGLOBIN A1C: Hgb A1c MFr Bld: 5.9 % (ref 4.6–6.5)

## 2023-07-13 MED ORDER — SERTRALINE HCL 25 MG PO TABS: 25.0000 mg | ORAL_TABLET | Freq: Every day | ORAL | 1 refills | Status: DC

## 2023-07-13 MED ORDER — CONTRAVE 8-90 MG PO TB12: ORAL_TABLET | ORAL | 0 refills | Status: DC

## 2023-07-13 NOTE — Assessment & Plan Note (Signed)
She is struggling with weight management and inconsistent exercise. We discussed various weight loss medications, including Wegovy, Zepbound, Phentermine, and Contrave. She expressed concerns about potential side effects and cost. She will check insurance coverage for weight loss medications and communicate via message. We encourage consistent exercise and a healthy diet.

## 2023-07-13 NOTE — Progress Notes (Addendum)
BP 122/80 (BP Location: Right Arm)   Pulse 71   Temp 98.2 F (36.8 C) (Oral)   Ht 5\' 6"  (1.676 m)   Wt 279 lb 12.8 oz (126.9 kg)   SpO2 96%   BMI 45.16 kg/m    Subjective:    Patient ID: Erika Mercer, female    DOB: 08/17/1971, 52 y.o.   MRN: [id number]  CC: Chief Complaint  Patient presents with   Annual Exam    With fasting lab work, discuss medication side effect    HPI: Erika Mercer is a 52 y.o. female presenting on 07/13/2023 for comprehensive medical examination. Current medical complaints include: depression, anxiety, and weight  Discussed the use of AI scribe software for clinical note transcription with the patient, who gave verbal consent to proceed.  History of Present Illness   The patient, with a history of depression and anxiety, had been self-managing her mental health with Lexapro. However, she discontinued the medication due to sleep disturbances, specifically waking up in the middle of the night. She reports feeling okay since discontinuing the medication but is open to trying different treatments for her mental health.  In addition to her mental health, the patient is struggling with weight management. She had previously tried Nutrisystem but discontinued it. She admits to inconsistent exercise habits, specifically walking, and expresses frustration with her weight. She also mentions a habit of eating when not hungry, particularly when alone in the living room watching TV. The patient is open to trying weight loss medications but expresses concerns about her eating habits potentially undermining the effectiveness of such treatments.  The patient also mentions having a colon cancer screening kit at home that she has yet to use. She reports no chest pain or shortness of breath. She has not been to the dentist in the last couple of years but has recently had a mammogram, which was normal.     She currently lives with: husband, kids, mother-in-law Menopausal  Symptoms: no  Depression and Anxiety Screen done today and results listed below:     07/13/2023    8:11 AM 05/13/2023    9:08 AM  Depression screen PHQ 2/9  Decreased Interest 2 0  Down, Depressed, Hopeless 1 1  PHQ - 2 Score 3 1  Altered sleeping 0 0  Tired, decreased energy 3 2  Change in appetite 3 2  Feeling bad or failure about yourself  2 0  Trouble concentrating 0 0  Moving slowly or fidgety/restless 0 0  Suicidal thoughts 0 0  PHQ-9 Score 11 5  Difficult doing work/chores Not difficult at all Not difficult at all      07/13/2023    8:11 AM 05/13/2023    9:08 AM  GAD 7 : Generalized Anxiety Score  Nervous, Anxious, on Edge 1 1  Control/stop worrying 0 0  Worry too much - different things 0 1  Trouble relaxing 0 0  Restless 0 0  Easily annoyed or irritable 0 0  Afraid - awful might happen 0 0  Total GAD 7 Score 1 2  Anxiety Difficulty Not difficult at all Not difficult at all    The patient does not have a history of falls. I did not complete a risk assessment for falls. A plan of care for falls was not documented.   Past Medical History:  Past Medical History:  Diagnosis Date   Anxiety    Depression     Surgical History:  Past Surgical History:  Procedure  Laterality Date   CESAREAN SECTION  03/03/1995   x2    Medications:  No current outpatient medications on file prior to visit.   No current facility-administered medications on file prior to visit.    Allergies:  Allergies  Allergen Reactions   Promethazine Anxiety    Restless leg    Social History:  Social History   Socioeconomic History   Marital status: Married    Spouse name: Not on file   Number of children: 2   Years of education: Not on file   Highest education level: Associate degree: academic program  Occupational History   Not on file  Tobacco Use   Smoking status: Never   Smokeless tobacco: Never  Vaping Use   Vaping status: Never Used  Substance and Sexual Activity    Alcohol use: Not Currently   Drug use: Never   Sexual activity: Not Currently  Other Topics Concern   Not on file  Social History Narrative   Not on file   Social Determinants of Health   Financial Resource Strain: Low Risk  (07/10/2023)   Overall Financial Resource Strain (CARDIA)    Difficulty of Paying Living Expenses: Not hard at all  Food Insecurity: No Food Insecurity (07/10/2023)   Hunger Vital Sign    Worried About Running Out of Food in the Last Year: Never true    Ran Out of Food in the Last Year: Never true  Transportation Needs: No Transportation Needs (07/10/2023)   PRAPARE - Administrator, Civil Service (Medical): No    Lack of Transportation (Non-Medical): No  Physical Activity: Insufficiently Active (07/10/2023)   Exercise Vital Sign    Days of Exercise per Week: 1 day    Minutes of Exercise per Session: 10 min  Stress: Patient Declined (07/10/2023)   [employer] of Occupational Health - Occupational Stress Questionnaire    Feeling of Stress : Patient declined  Social Connections: Socially Isolated (07/10/2023)   Social Connection and Isolation Panel [NHANES]    Frequency of Communication with Friends and Family: Twice a week    Frequency of Social Gatherings with Friends and Family: Never    Attends Religious Services: Never    Database administrator or Organizations: No    Attends Engineer, structural: Not on file    Marital Status: Married  Catering manager Violence: Not on file   Social History   Tobacco Use  Smoking Status Never  Smokeless Tobacco Never   Social History   Substance and Sexual Activity  Alcohol Use Not Currently    Family History:  Family History  Problem Relation Age of Onset   Cancer Father        prostate, leukemia   Diabetes Father    Intellectual disability Sister        down syndrome   ADD / ADHD Maternal Uncle    Breast cancer Maternal Grandmother    Cancer Maternal Grandmother        breast    Asthma Brother    ADD / ADHD Son     Past medical history, surgical history, medications, allergies, family history and social history reviewed with patient today and changes made to appropriate areas of the chart.   Review of Systems  Constitutional: Negative.   HENT: Negative.    Eyes: Negative.   Respiratory: Negative.    Cardiovascular: Negative.   Gastrointestinal: Negative.   Genitourinary: Negative.   Musculoskeletal: Negative.   Skin: Negative.  Neurological: Negative.   Psychiatric/Behavioral: Negative.     All other ROS negative except what is listed above and in the HPI.      Objective:    BP 122/80 (BP Location: Right Arm)   Pulse 71   Temp 98.2 F (36.8 C) (Oral)   Ht 5\' 6"  (1.676 m)   Wt 279 lb 12.8 oz (126.9 kg)   SpO2 96%   BMI 45.16 kg/m   Wt Readings from Last 3 Encounters:  07/13/23 279 lb 12.8 oz (126.9 kg)  05/13/23 277 lb (125.6 kg)    Physical Exam Vitals and nursing note reviewed. Exam conducted with a chaperone present.  Constitutional:      General: She is not in acute distress.    Appearance: Normal appearance.  HENT:     Head: Normocephalic and atraumatic.     Right Ear: Tympanic membrane, ear canal and external ear normal.     Left Ear: Tympanic membrane, ear canal and external ear normal.  Eyes:     Conjunctiva/sclera: Conjunctivae normal.  Cardiovascular:     Rate and Rhythm: Normal rate and regular rhythm.     Pulses: Normal pulses.     Heart sounds: Normal heart sounds.  Pulmonary:     Effort: Pulmonary effort is normal.     Breath sounds: Normal breath sounds.  Abdominal:     Palpations: Abdomen is soft.     Tenderness: There is no abdominal tenderness.  Genitourinary:    General: Normal vulva.     Exam position: Lithotomy position.     Labia:        Right: No rash, tenderness or lesion.        Left: No rash, tenderness or lesion.      Vagina: Normal.     Cervix: Normal.     Uterus: Normal.      Adnexa: Right  adnexa normal and left adnexa normal.  Musculoskeletal:        General: Normal range of motion.     Cervical back: Normal range of motion and neck supple.     Right lower leg: No edema.     Left lower leg: No edema.  Lymphadenopathy:     Cervical: No cervical adenopathy.  Skin:    General: Skin is warm and dry.  Neurological:     General: No focal deficit present.     Mental Status: She is alert and oriented to person, place, and time.     Cranial Nerves: No cranial nerve deficit.     Coordination: Coordination normal.     Gait: Gait normal.  Psychiatric:        Mood and Affect: Mood normal.        Behavior: Behavior normal.        Thought Content: Thought content normal.        Judgment: Judgment normal.     Results for orders placed or performed in visit on 07/13/23  CBC with Differential/Platelet  Result Value Ref Range   WBC 5.9 4.0 - 10.5 K/uL   RBC 4.75 3.87 - 5.11 Mil/uL   Hemoglobin 13.6 12.0 - 15.0 g/dL   HCT 40.9 81.1 - 91.4 %   MCV 89.9 78.0 - 100.0 fl   MCHC 31.8 30.0 - 36.0 g/dL   RDW 78.2 95.6 - 21.3 %   Platelets 318.0 150.0 - 400.0 K/uL   Neutrophils Relative % 57.2 43.0 - 77.0 %   Lymphocytes Relative 33.0 12.0 - 46.0 %  Monocytes Relative 6.6 3.0 - 12.0 %   Eosinophils Relative 2.2 0.0 - 5.0 %   Basophils Relative 1.0 0.0 - 3.0 %   Neutro Abs 3.4 1.4 - 7.7 K/uL   Lymphs Abs 1.9 0.7 - 4.0 K/uL   Monocytes Absolute 0.4 0.1 - 1.0 K/uL   Eosinophils Absolute 0.1 0.0 - 0.7 K/uL   Basophils Absolute 0.1 0.0 - 0.1 K/uL  Hemoglobin A1c  Result Value Ref Range   Hgb A1c MFr Bld 5.9 4.6 - 6.5 %  Comprehensive metabolic panel  Result Value Ref Range   Sodium 141 135 - 145 mEq/L   Potassium 4.5 3.5 - 5.1 mEq/L   Chloride 104 96 - 112 mEq/L   CO2 31 19 - 32 mEq/L   Glucose, Bld 108 (H) 70 - 99 mg/dL   BUN 11 6 - 23 mg/dL   Creatinine, Ser 1.61 0.40 - 1.20 mg/dL   Total Bilirubin 0.4 0.2 - 1.2 mg/dL   Alkaline Phosphatase 46 39 - 117 U/L   AST 27 0 - 37  U/L   ALT 32 0 - 35 U/L   Total Protein 6.5 6.0 - 8.3 g/dL   Albumin 4.1 3.5 - 5.2 g/dL   GFR 09.60 >45.40 mL/min   Calcium 9.2 8.4 - 10.5 mg/dL  Lipid panel  Result Value Ref Range   Cholesterol 138 0 - 200 mg/dL   Triglycerides 98.1 0.0 - 149.0 mg/dL   HDL 19.14 >78.29 mg/dL   VLDL 56.2 0.0 - 13.0 mg/dL   LDL Cholesterol 69 0 - 99 mg/dL   Total CHOL/HDL Ratio 3    NonHDL 83.99   [facility] HIV SCREENING LAB  Result Value Ref Range   [facility] HIV Screening Negative - Validated   [facility] HEPATITIS C SCREENING LAB  Result Value Ref Range   [facility] Hepatitis Screen Negative-Validated       Assessment & Plan:   Problem List Items Addressed This Visit       Other   Anxiety and depression    She discontinued Lexapro due to insomnia and had previously tried Wellbutrin with Lexapro. Currently asymptomatic, she is open to trying a different medication. Will have her start zoloft 25mg  daily. Discussed possible side effects. Follow-up in 4-6 weeks.       Relevant Medications   sertraline (ZOLOFT) 25 MG tablet   Morbid obesity (HCC)    She is struggling with weight management and inconsistent exercise. We discussed various weight loss medications, including Wegovy, Zepbound, Phentermine, and Contrave. She expressed concerns about potential side effects and cost. She will check insurance coverage for weight loss medications and communicate via message. We encourage consistent exercise and a healthy diet.      Relevant Orders   Lipid panel (Completed)   Routine general medical examination at a health care facility - Primary    Health maintenance reviewed and updated. Discussed nutrition, exercise. Check CMP, CBC today. Follow-up 1 year.        Relevant Orders   CBC with Differential/Platelet (Completed)   Comprehensive metabolic panel (Completed)   Prediabetes    Check A1c today and treat based on results.       Relevant Orders   Hemoglobin A1c (Completed)   Other Visit Diagnoses     Screening  for cervical cancer       Pap with HPV done today   Relevant Orders   Cytology - PAP        Follow up plan: Return in about  6 weeks (around 08/24/2023) for 6-8 weeks, Depression, Anxiety, weight management.   LABORATORY TESTING:  - Pap smear: done elsewhere  IMMUNIZATIONS:   - Tdap: Tetanus vaccination status reviewed: last tetanus booster within 10 years. - Influenza:  Declined - Pneumovax: Not applicable - Prevnar: Not applicable - HPV: Not applicable - Shingrix vaccine:  Declined  SCREENING: -Mammogram: Up to date  - Colonoscopy:  Has cologuard kit at home, encouraged to complete   - Bone Density: Not applicable   PATIENT COUNSELING:   Advised to take 1 mg of folate supplement per day if capable of pregnancy.   Sexuality: Discussed sexually transmitted diseases, partner selection, use of condoms, avoidance of unintended pregnancy  and contraceptive alternatives.   Advised to avoid cigarette smoking.  I discussed with the patient that most people either abstain from alcohol or drink within safe limits (<=14/week and <=4 drinks/occasion for males, <=7/weeks and <= 3 drinks/occasion for females) and that the risk for alcohol disorders and other health effects rises proportionally with the number of drinks per week and how often a drinker exceeds daily limits.  Discussed cessation/primary prevention of drug use and availability of treatment for abuse.   Diet: Encouraged to adjust caloric intake to maintain  or achieve ideal body weight, to reduce intake of dietary saturated fat and total fat, to limit sodium intake by avoiding high sodium foods and not adding table salt, and to maintain adequate dietary potassium and calcium preferably from fresh fruits, vegetables, and low-fat dairy products.    stressed the importance of regular exercise  Injury prevention: Discussed safety belts, safety helmets, smoke detector, smoking near bedding or upholstery.   Dental health:  Discussed importance of regular tooth brushing, flossing, and dental visits.    NEXT PREVENTATIVE PHYSICAL DUE IN 1 YEAR. Return in about 6 weeks (around 08/24/2023) for 6-8 weeks, Depression, Anxiety, weight management.  Ivylynn Hoppes A Ellwood Steidle

## 2023-07-13 NOTE — Assessment & Plan Note (Signed)
She discontinued Lexapro due to insomnia and had previously tried Wellbutrin with Lexapro. Currently asymptomatic, she is open to trying a different medication. Will have her start zoloft 25mg  daily. Discussed possible side effects. Follow-up in 4-6 weeks.

## 2023-07-13 NOTE — Patient Instructions (Signed)
It was great to see you!  Send me a message with the name of the other medication you were taking for depression and anxiety.   Let me know if your insurance covers weight loss medications.   Let's follow-up in 6-8 weeks, sooner if you have concerns.  If a referral was placed today, you will be contacted for an appointment. Please note that routine referrals can sometimes take up to 3-4 weeks to process. Please call our office if you haven't heard anything after this time frame.  Take care,  Rodman Pickle, NP

## 2023-07-13 NOTE — Assessment & Plan Note (Signed)
Health maintenance reviewed and updated. Discussed nutrition, exercise. Check CMP, CBC today. Follow-up 1 year.   

## 2023-07-13 NOTE — Assessment & Plan Note (Signed)
Check A1c today and treat based on results.  

## 2023-07-19 LAB — CYTOLOGY - PAP
Adequacy: ABSENT
Comment: NEGATIVE
Diagnosis: NEGATIVE
High risk HPV: NEGATIVE

## 2023-08-19 ENCOUNTER — Encounter: Payer: Self-pay | Admitting: Nurse Practitioner

## 2023-08-19 ENCOUNTER — Ambulatory Visit: Payer: No Typology Code available for payment source | Admitting: Nurse Practitioner

## 2023-08-19 VITALS — BP 122/84 | HR 64 | Temp 97.5°F | Ht 66.0 in | Wt 281.4 lb

## 2023-08-19 DIAGNOSIS — F419 Anxiety disorder, unspecified: Secondary | ICD-10-CM | POA: Diagnosis not present

## 2023-08-19 DIAGNOSIS — Z6841 Body Mass Index (BMI) 40.0 and over, adult: Secondary | ICD-10-CM

## 2023-08-19 DIAGNOSIS — F32A Depression, unspecified: Secondary | ICD-10-CM | POA: Diagnosis not present

## 2023-08-19 MED ORDER — SERTRALINE HCL 25 MG PO TABS: 25.0000 mg | ORAL_TABLET | Freq: Every day | ORAL | 1 refills | Status: DC

## 2023-08-19 MED ORDER — CONTRAVE 8-90 MG PO TB12: ORAL_TABLET | ORAL | 2 refills | Status: DC

## 2023-08-19 NOTE — Assessment & Plan Note (Signed)
She had a brief interruption in Contrave use due to forgetting the medication during travel but has restarted it today. Previously, she was taking two tablets in the morning and one at night. She reports minor constipation as a side effect. We will resume Contrave at one tablet daily for one week, then one tablet twice daily for one week, then increase to two tablets in the morning and one at night for another week, and finally to two tablets twice daily as tolerated. She is encouraged to take the evening dose with dinner to avoid potential insomnia from the Wellbutrin component of Contrave. Continue to focus on nutrition and exercise.

## 2023-08-19 NOTE — Assessment & Plan Note (Signed)
Chronic, stable. Continue zoloft 25mg  daily. Refill sent to the pharmacy.

## 2023-08-19 NOTE — Progress Notes (Signed)
Established Patient Office Visit  Subjective   Patient ID: Erika Mercer, female    DOB: 03/14/1971  Age: 52 y.o. MRN: [id number]  Chief Complaint  Patient presents with   Anxiety and Depression and Weight Management    Follow up, no concerns    HPI  Discussed the use of AI scribe software for clinical note transcription with the patient, who gave verbal consent to proceed.  History of Present Illness   The patient, with a history of depression, anxiety, and obesity, presents for a follow-up visit. She reports a recent weight gain, which she attributes to poor dietary choices over the holiday season. She had started Contrave for weight loss, but discontinued it for a period of two weeks due to forgetting to take it while traveling. She restarted the medication today. While on Contrave, she experienced mild constipation as a side effect. She had been taking two pills in the morning and one at night, and plans to gradually increase the dose back to this level.  In addition, the patient has been taking Zoloft for depression, which she also restarted today. She reports a noticeable improvement in her mood and ability to handle stress, even during the two-week period when she was not taking the medication. She has not started an exercise regimen yet.       ROS See pertinent positives and negatives per HPI.    Objective:     BP 122/84 (BP Location: Right Arm)   Pulse 64   Temp (!) 97.5 F (36.4 C)   Ht 5\' 6"  (1.676 m)   Wt 281 lb 6.4 oz (127.6 kg)   SpO2 99%   BMI 45.42 kg/m  BP Readings from Last 3 Encounters:  08/19/23 122/84  07/13/23 122/80  05/13/23 124/80   Wt Readings from Last 3 Encounters:  08/19/23 281 lb 6.4 oz (127.6 kg)  07/13/23 279 lb 12.8 oz (126.9 kg)  05/13/23 277 lb (125.6 kg)      Physical Exam Vitals and nursing note reviewed.  Constitutional:      General: She is not in acute distress.    Appearance: Normal appearance. She is obese.  HENT:      Head: Normocephalic.  Eyes:     Conjunctiva/sclera: Conjunctivae normal.  Cardiovascular:     Rate and Rhythm: Normal rate and regular rhythm.     Pulses: Normal pulses.     Heart sounds: Normal heart sounds.  Pulmonary:     Effort: Pulmonary effort is normal.     Breath sounds: Normal breath sounds.  Musculoskeletal:     Cervical back: Normal range of motion.  Skin:    General: Skin is warm.  Neurological:     General: No focal deficit present.     Mental Status: She is alert and oriented to person, place, and time.  Psychiatric:        Mood and Affect: Mood normal.        Behavior: Behavior normal.        Thought Content: Thought content normal.        Judgment: Judgment normal.    The 10-year ASCVD risk score (Arnett DK, et al., 2019) is: 1.1%    Assessment & Plan:   Problem List Items Addressed This Visit       Other   Anxiety and depression - Primary   Chronic, stable. Continue zoloft 25mg  daily. Refill sent to the pharmacy.       Relevant Medications   sertraline (ZOLOFT)  25 MG tablet   Morbid obesity (HCC)   She had a brief interruption in Contrave use due to forgetting the medication during travel but has restarted it today. Previously, she was taking two tablets in the morning and one at night. She reports minor constipation as a side effect. We will resume Contrave at one tablet daily for one week, then one tablet twice daily for one week, then increase to two tablets in the morning and one at night for another week, and finally to two tablets twice daily as tolerated. She is encouraged to take the evening dose with dinner to avoid potential insomnia from the Wellbutrin component of Contrave. Continue to focus on nutrition and exercise.       Relevant Medications   Naltrexone-buPROPion HCl ER (CONTRAVE) 8-90 MG TB12    Return in about 3 months (around 11/17/2023) for weight management .    Gerre Scull, NP

## 2023-08-19 NOTE — Patient Instructions (Signed)
It was great to see you!  I have refilled your medications  Let's follow-up in 3 months, sooner if you have concerns.  If a referral was placed today, you will be contacted for an appointment. Please note that routine referrals can sometimes take up to 3-4 weeks to process. Please call our office if you haven't heard anything after this time frame.  Take care,  Vance Peper, NP

## 2023-08-20 IMAGING — DX DG CHEST 2V
2 series · 2 of 2 positions shown · non-contrast
Comparison: None Available.

CLINICAL DATA: Rales in left lower lobe, fever, cough

EXAM:
CHEST - 2 VIEW

[chest pa]
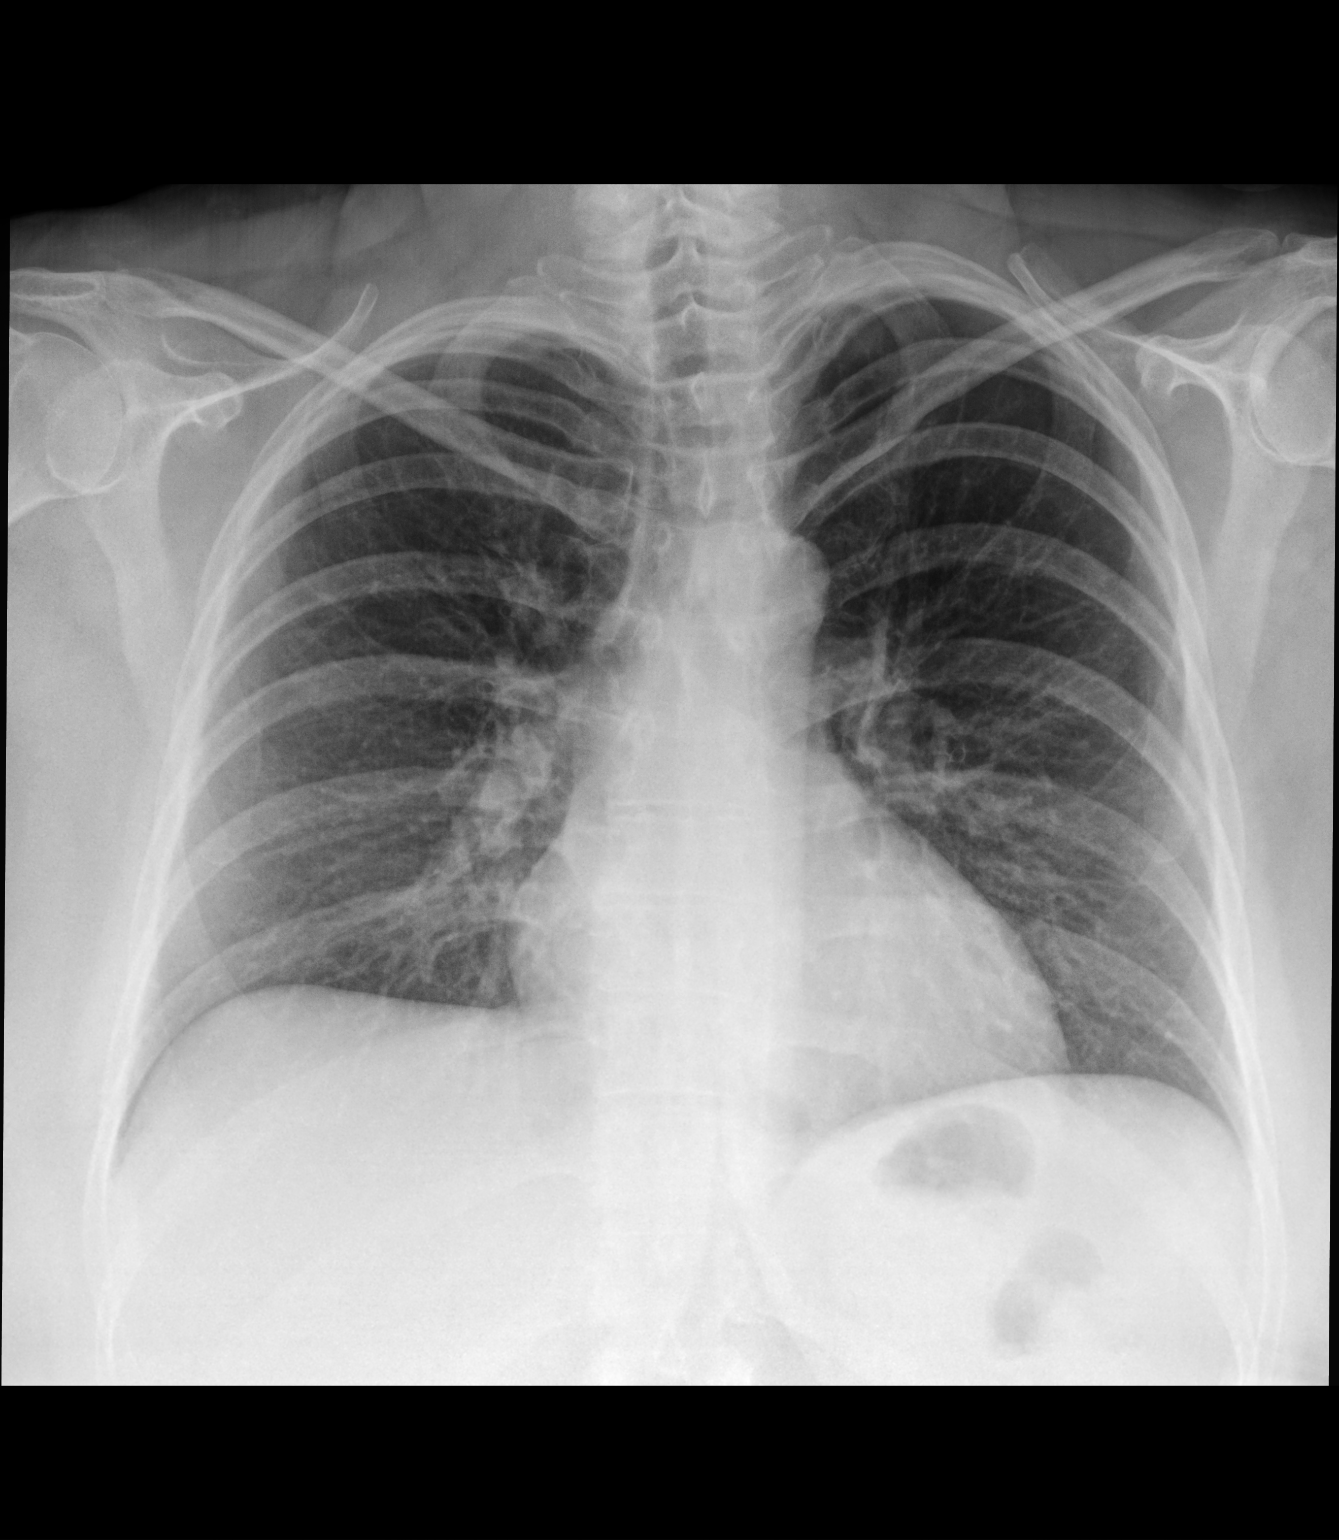

[chest lat]
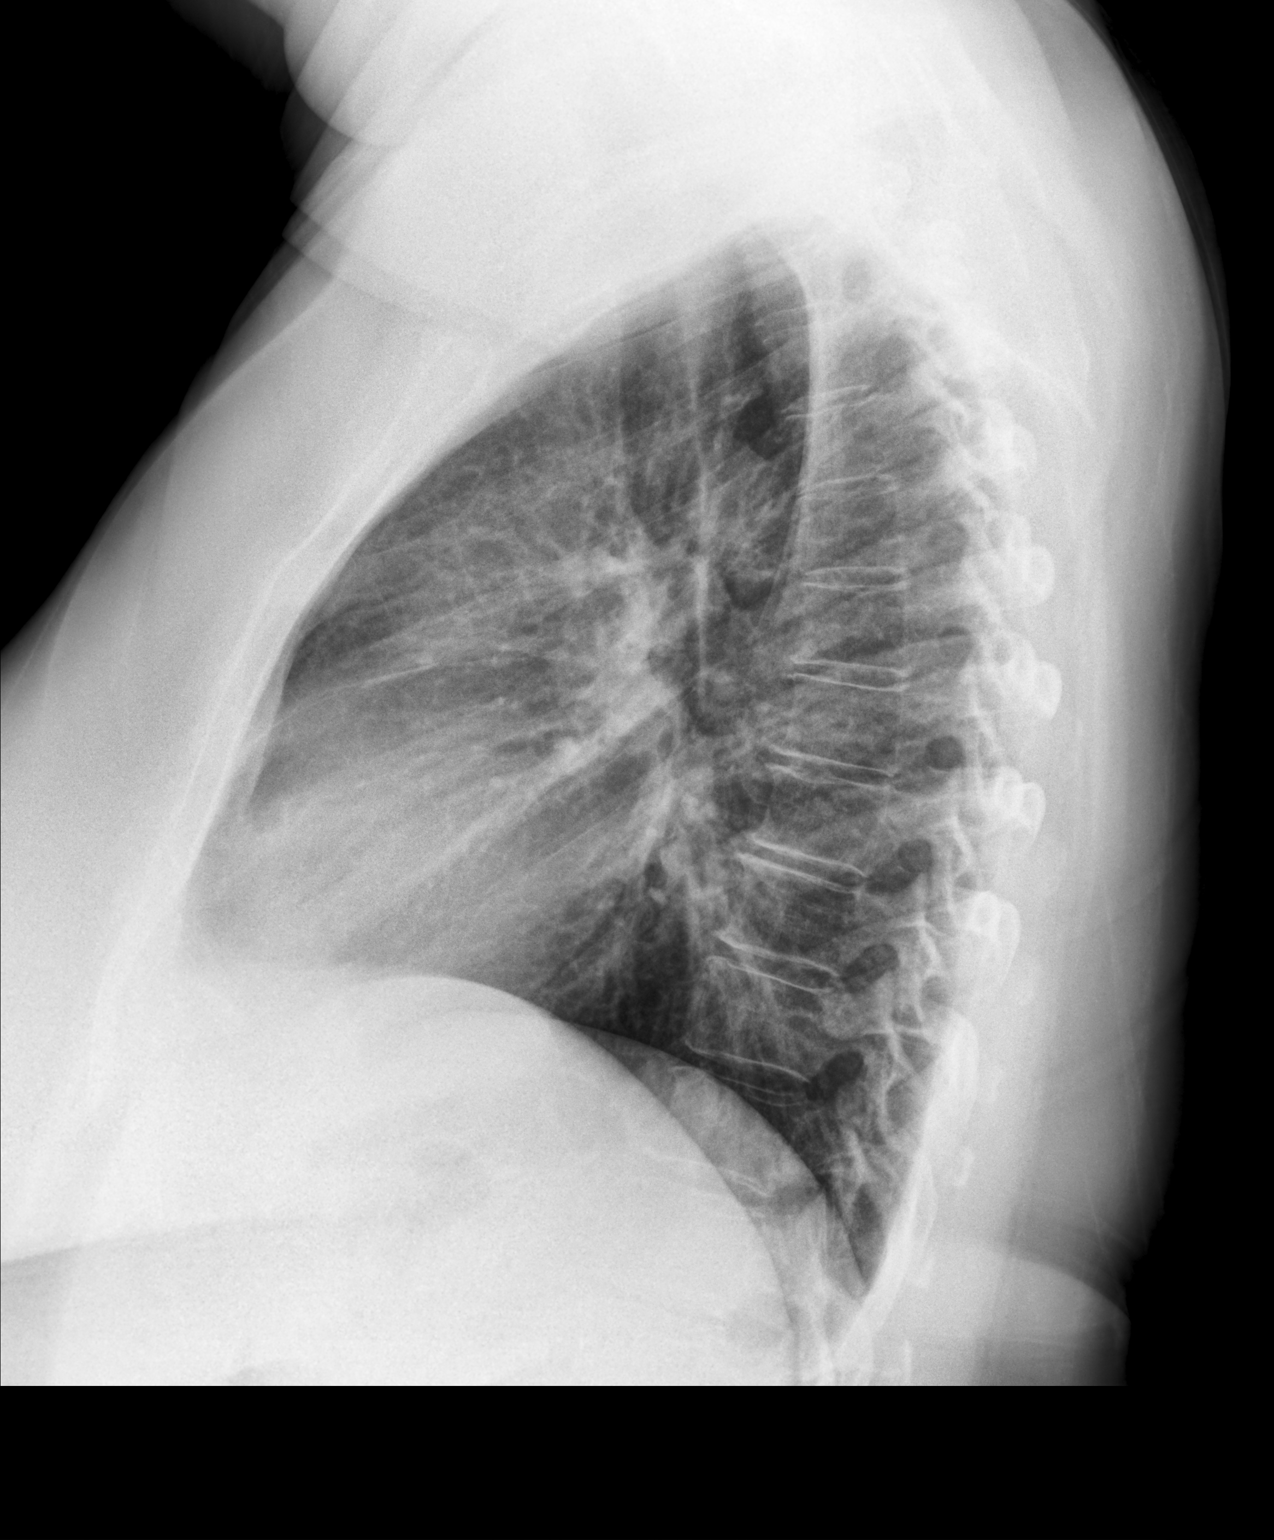

[2 of 2 positions shown; findings below may reference images not displayed]

FINDINGS: The cardiomediastinal silhouette is normal.

There is no focal consolidation or pulmonary edema. There is no
pleural effusion or pneumothorax.

There is no acute osseous abnormality.
IMPRESSION: No radiographic evidence of acute cardiopulmonary process.

## 2023-11-21 ENCOUNTER — Ambulatory Visit
Admission: EM | Admit: 2023-11-21 | Discharge: 2023-11-21 | Disposition: A | Discharge status: Home / Self Care | Attending: Family Medicine | Admitting: Family Medicine

## 2023-11-21 DIAGNOSIS — R42 Dizziness and giddiness: Secondary | ICD-10-CM

## 2023-11-21 MED ORDER — MECLIZINE HCL 12.5 MG PO TABS: 12.5000 mg | ORAL_TABLET | Freq: Three times a day (TID) | ORAL | 0 refills | Status: DC | PRN

## 2023-11-21 NOTE — ED Provider Notes (Signed)
 [facility]  Note:  This document was prepared using Conservation officer, historic buildings and may include unintentional dictation errors.  MRN: [id number] DOB: [birth year]  Subjective:   Erika Mercer is a 53 y.o. female presenting for 4-day history of acute onset persistent intermittent dizziness, vertigo, room spinning sensation.  Has also felt fatigued.  Admits that she has not been hydrating very well this past week.  She started to do so yesterday.  Has used an antihistamine for allergies but did not experience any relief.  Denies fever, sinus pain, headache, confusion, weakness, numbness or tingling, ear pain, ear drainage, tinnitus, throat pain, cough, respiratory symptoms, chest pain, shortness of breath.  Has a history of atrial fibrillation but has not taken anything consistently for this.  No current facility-administered medications for this encounter.  Current Outpatient Medications:    Naltrexone-buPROPion HCl ER (CONTRAVE) 8-90 MG TB12, Start 1 tablet every morning for 7 days, then 1 tablet twice daily for 7 days, then 2 tablets every morning and one every evening for 7 days, then 2 tablet twice daily, Disp: 120 tablet, Rfl: 2   sertraline (ZOLOFT) 25 MG tablet, Take 1 tablet (25 mg total) by mouth daily., Disp: 90 tablet, Rfl: 1   Allergies  Allergen Reactions   Promethazine Anxiety    Restless leg    Past Medical History:  Diagnosis Date   Anxiety    Depression      Past Surgical History:  Procedure Laterality Date   CESAREAN SECTION  03/03/1995   x2    Family History  Problem Relation Age of Onset   Cancer Father        prostate, leukemia   Diabetes Father    Intellectual disability Sister        down syndrome   ADD / ADHD Maternal Uncle    Breast cancer Maternal Grandmother    Cancer Maternal Grandmother        breast   Asthma Brother    ADD / ADHD Son     Social History   Tobacco Use   Smoking status: Never   Smokeless  tobacco: Never  Vaping Use   Vaping status: Never Used  Substance Use Topics   Alcohol use: Not Currently   Drug use: Never    ROS   Objective:   Vitals: BP 136/85 (BP Location: Right Arm)   Pulse 70   Temp 99.4 F (37.4 C) (Oral)   Ht 5\' 6"  (1.676 m)   Wt 280 lb (127 kg)   SpO2 96%   BMI 45.19 kg/m   Physical Exam Constitutional:      General: She is not in acute distress.    Appearance: Normal appearance. She is well-developed and normal weight. She is not ill-appearing, toxic-appearing or diaphoretic.  HENT:     Head: Normocephalic and atraumatic.     Right Ear: Tympanic membrane, ear canal and external ear normal. No drainage or tenderness. No middle ear effusion. There is no impacted cerumen. Tympanic membrane is not erythematous or bulging.     Left Ear: Tympanic membrane, ear canal and external ear normal. No drainage or tenderness.  No middle ear effusion. There is no impacted cerumen. Tympanic membrane is not erythematous or bulging.     Nose: Nose normal. No congestion or rhinorrhea.     Mouth/Throat:     Mouth: Mucous membranes are moist. No oral lesions.     Pharynx: No pharyngeal swelling, oropharyngeal exudate, posterior oropharyngeal erythema  or uvula swelling.     Tonsils: No tonsillar exudate or tonsillar abscesses.  Eyes:     General: No scleral icterus.       Right eye: No discharge.        Left eye: No discharge.     Extraocular Movements: Extraocular movements intact.     Right eye: Normal extraocular motion.     Left eye: Normal extraocular motion.     Conjunctiva/sclera: Conjunctivae normal.  Cardiovascular:     Rate and Rhythm: Normal rate and regular rhythm.     Heart sounds: Normal heart sounds. No murmur heard.    No friction rub. No gallop.  Pulmonary:     Effort: Pulmonary effort is normal. No respiratory distress.     Breath sounds: No stridor. No wheezing, rhonchi or rales.  Chest:     Chest wall: No tenderness.  Musculoskeletal:      Cervical back: Normal range of motion and neck supple.  Lymphadenopathy:     Cervical: No cervical adenopathy.  Skin:    General: Skin is warm and dry.  Neurological:     General: No focal deficit present.     Mental Status: She is alert and oriented to person, place, and time.  Psychiatric:        Mood and Affect: Mood normal.        Behavior: Behavior normal.    ED ECG REPORT   Date: 11/21/2023  EKG Time: 1:18 PM  Rate: 63bpm  Rhythm: normal sinus rhythm,  there are no previous tracings available for comparison  Axis: normal  Intervals:none  ST&T Change: T wave flattening in lead III  Narrative Interpretation: Nonspecific T wave changes above, otherwise sinus rhythm at 63 bpm.  No previous EKG available for comparison.  No acute findings.  Assessment and Plan :   PDMP not reviewed this encounter.  1. Vertigo    Discussed general management of vertigo, etiologies for vertigo.  At this stage I do not suspect an acute emergent process.  Recommended conservative management, push fluids, meclizine as needed.  Counseled patient on potential for adverse effects with medications prescribed/recommended today, ER and return-to-clinic precautions discussed, patient verbalized understanding.    Wallis Bamberg, PA-C 11/21/23 1326

## 2023-11-21 NOTE — ED Triage Notes (Signed)
 Patient states she is feeling dizziness and fatigue x day 4. Treated with an antihistamine without relief.

## 2023-11-25 ENCOUNTER — Ambulatory Visit: Payer: Self-pay | Admitting: Nurse Practitioner

## 2023-11-25 ENCOUNTER — Encounter: Payer: Self-pay | Admitting: Emergency Medicine

## 2023-11-25 ENCOUNTER — Ambulatory Visit: Admission: EM | Admit: 2023-11-25 | Discharge: 2023-11-25 | Disposition: A | Discharge status: Home / Self Care

## 2023-11-25 DIAGNOSIS — H8110 Benign paroxysmal vertigo, unspecified ear: Secondary | ICD-10-CM | POA: Diagnosis not present

## 2023-11-25 NOTE — Telephone Encounter (Signed)
  Chief Complaint: vertigo  Symptoms: dizzy  Disposition: [] ED /[x] Urgent Care (no appt availability in office) / [] Appointment(In office/virtual)/ []  [facility]/ [] Home Care/ [] Refused Recommended Disposition /[]  Mobile Bus/ []  Follow-up with PCP Additional Notes: Pt calling with complaints of vertigo after starting Anitvert on 3/13. Pt went to UC on 3/13 and has been taking Antivert with no relief. Pt describes the vertigo as " my eyes feel crossed when trying to focus. If I turn my head too fast, I'll get dizzy." Pt feels unsteady on feet at times but takes it slow. No provider appts today and pt unable to do tomorrow due to travel. RN advised pt to go back to Urgent Care and call back to set PCP appt  once back in town. RN gave care advice and pt verbalized understanding.              Copied from CRM [id number]. Topic: Clinical - Red Word Triage >> Nov 25, 2023  2:01 PM Desma Mcgregor wrote: Red Word that prompted transfer to Nurse Triage: Patient has vertigo and its been for a week now. Was prescribed meclizine (ANTIVERT) 12.5 MG tablet, but that has not helped at all. Not sure what to do at this point. Reason for Disposition  [1] MODERATE dizziness (e.g., vertigo; feels very unsteady, interferes with normal activities) AND [2] has NOT been evaluated by doctor (or NP/PA) for this  Answer Assessment - Initial Assessment Questions 1. DESCRIPTION: "Describe your dizziness."     "When moves eyes, everything is resetting, unsteady on feet:  2. VERTIGO: "Do you feel like either you or the room is spinning or tilting?"      Not sure 3. LIGHTHEADED: "Do you feel lightheaded?" (e.g., somewhat faint, woozy, weak upon standing)     Denies  4. SEVERITY: "How bad is it?"  "Can you walk?"   - MILD: Feels slightly dizzy and unsteady, but is walking normally.   - MODERATE: Feels unsteady when walking, but not falling; interferes with normal activities (e.g., school, work).   -  SEVERE: Unable to walk without falling, or requires assistance to walk without falling.     Moderate  5. ONSET:  "When did the dizziness begin?"     11/18/23 6. AGGRAVATING FACTORS: "Does anything make it worse?" (e.g., standing, change in head position)     Trying to focus- feels cross-eyed, lifts/turns  head up fast 7. CAUSE: "What do you think is causing the dizziness?"     Not sure 8. RECURRENT SYMPTOM: "Have you had dizziness before?" If Yes, ask: "When was the last time?" "What happened that time?"     Few years ago  9. OTHER SYMPTOMS: "Do you have any other symptoms?" (e.g., headache, weakness, numbness, vomiting, earache)     Denies  Protocols used: Dizziness - Vertigo-A-AH

## 2023-11-25 NOTE — Discharge Instructions (Addendum)
 1. Benign paroxysmal positional vertigo, unspecified laterality (Primary) -Follow-up with otolaryngology for further evaluation of ongoing benign positional vertigo. -Further evaluation necessary to discover cause of ongoing vertigo symptoms. -If symptoms become worse or you develop new symptoms follow-up in ER immediately for further evaluation and management

## 2023-11-25 NOTE — Telephone Encounter (Signed)
 Noted. Patient advised to go to urgent care by nurse triage.

## 2023-11-25 NOTE — ED Provider Notes (Signed)
 [facility]  Note:  This document was prepared using Dragon voice recognition software and may include unintentional dictation errors.  MRN: [id number] DOB: [birth year]  Subjective:   Erika Mercer is a 53 y.o. female presenting for ongoing severe vertigo symptoms since March 13.  Patient was seen in urgent care and prescribed meclizine for vertigo however she states that meclizine is not helping her symptoms and only makes her tired.  Patient denies any other secondary symptoms aside from fatigue.  No vision changes, blurred vision, headaches, body ache, sinus pressure or pain, shortness of breath, chest pain, weakness.  Patient has past history of benign positional vertigo.  No current facility-administered medications for this encounter.  Current Outpatient Medications:    meclizine (ANTIVERT) 12.5 MG tablet, Take 1 tablet (12.5 mg total) by mouth 3 (three) times daily as needed for dizziness., Disp: 30 tablet, Rfl: 0   Naltrexone-buPROPion HCl ER (CONTRAVE) 8-90 MG TB12, Start 1 tablet every morning for 7 days, then 1 tablet twice daily for 7 days, then 2 tablets every morning and one every evening for 7 days, then 2 tablet twice daily, Disp: 120 tablet, Rfl: 2   sertraline (ZOLOFT) 25 MG tablet, Take 1 tablet (25 mg total) by mouth daily., Disp: 90 tablet, Rfl: 1   Allergies  Allergen Reactions   Promethazine Anxiety    Restless leg    Past Medical History:  Diagnosis Date   Anxiety    Depression      Past Surgical History:  Procedure Laterality Date   CESAREAN SECTION  03/03/1995   x2    Family History  Problem Relation Age of Onset   Cancer Father        prostate, leukemia   Diabetes Father    Intellectual disability Sister        down syndrome   ADD / ADHD Maternal Uncle    Breast cancer Maternal Grandmother    Cancer Maternal Grandmother        breast   Asthma Brother    ADD / ADHD Son     Social History   Tobacco Use   Smoking status: Never    Smokeless tobacco: Never  Vaping Use   Vaping status: Never Used  Substance Use Topics   Alcohol use: Not Currently   Drug use: Never    ROS Refer to HPI for ROS details.  Objective:   Vitals: BP 131/86 (BP Location: Right Arm)   Pulse 76   Temp 98.5 F (36.9 C) (Oral)   Resp 17   SpO2 98%   Physical Exam Vitals and nursing note reviewed.  Constitutional:      General: She is not in acute distress.    Appearance: Normal appearance. She is well-developed. She is not ill-appearing or toxic-appearing.  HENT:     Head: Normocephalic.     Right Ear: Tympanic membrane, ear canal and external ear normal.     Left Ear: Tympanic membrane, ear canal and external ear normal.     Nose: Nose normal.     Mouth/Throat:     Mouth: Mucous membranes are moist.     Pharynx: Oropharynx is clear.  Eyes:     General: Lids are normal. Vision grossly intact. Gaze aligned appropriately. No visual field deficit.       Right eye: No discharge.        Left eye: No discharge.     Extraocular Movements: Extraocular movements intact.     Right eye: Normal  extraocular motion.     Left eye: Normal extraocular motion.     Conjunctiva/sclera: Conjunctivae normal.     Pupils: Pupils are equal, round, and reactive to light.  Cardiovascular:     Rate and Rhythm: Normal rate.  Pulmonary:     Effort: Pulmonary effort is normal. No respiratory distress.  Skin:    General: Skin is warm and dry.  Neurological:     General: No focal deficit present.     Mental Status: She is alert and oriented to person, place, and time.     Cranial Nerves: No cranial nerve deficit.     Sensory: No sensory deficit.     Motor: No weakness.     Gait: Gait normal.  Psychiatric:        Mood and Affect: Mood normal.        Behavior: Behavior normal.        Thought Content: Thought content normal.        Judgment: Judgment normal.     Procedures  No results found for this or any previous visit (from the past 24  hours).  Assessment and Plan :   PDMP not reviewed this encounter.  1. Benign paroxysmal positional vertigo, unspecified laterality    1. Benign paroxysmal positional vertigo, unspecified laterality (Primary) -Follow-up with otolaryngology for further evaluation of ongoing benign positional vertigo. -Further evaluation necessary to discover cause of ongoing vertigo symptoms. -If symptoms become worse or you develop new symptoms follow-up in ER immediately for further evaluation and management   Oda Cogan Crecencio Mc, Devanee Pomplun B, NP 11/25/23 1459

## 2023-11-25 NOTE — ED Triage Notes (Signed)
 Pt c/o vertigo since March 13th.  Pt states occurs when she turns her head

## 2023-12-01 ENCOUNTER — Encounter: Payer: Self-pay | Admitting: Nurse Practitioner

## 2023-12-01 ENCOUNTER — Ambulatory Visit: Admitting: Nurse Practitioner

## 2023-12-01 VITALS — BP 142/96 | HR 68 | Temp 97.1°F | Ht 66.0 in | Wt 278.6 lb

## 2023-12-01 DIAGNOSIS — H532 Diplopia: Secondary | ICD-10-CM | POA: Diagnosis not present

## 2023-12-01 DIAGNOSIS — R42 Dizziness and giddiness: Secondary | ICD-10-CM | POA: Diagnosis not present

## 2023-12-01 DIAGNOSIS — R030 Elevated blood-pressure reading, without diagnosis of hypertension: Secondary | ICD-10-CM | POA: Insufficient documentation

## 2023-12-01 LAB — COMPREHENSIVE METABOLIC PANEL WITH GFR
ALT: 29 U/L (ref 0–35)
AST: 21 U/L (ref 0–37)
Albumin: 4.4 g/dL (ref 3.5–5.2)
Alkaline Phosphatase: 45 U/L (ref 39–117)
BUN: 12 mg/dL (ref 6–23)
CO2: 29 meq/L (ref 19–32)
Calcium: 9.7 mg/dL (ref 8.4–10.5)
Chloride: 103 meq/L (ref 96–112)
Creatinine, Ser: 0.65 mg/dL (ref 0.40–1.20)
GFR: 99.7 mL/min (ref >60.00)
Glucose, Bld: 105 mg/dL — ABNORMAL HIGH (ref 70–99)
Potassium: 4.5 meq/L (ref 3.5–5.1)
Sodium: 139 meq/L (ref 135–145)
Total Bilirubin: 0.4 mg/dL (ref 0.2–1.2)
Total Protein: 6.8 g/dL (ref 6.0–8.3)

## 2023-12-01 LAB — CBC WITH DIFFERENTIAL/PLATELET
Basophils Absolute: 0 10*3/uL (ref 0.0–0.1)
Basophils Relative: 0.9 % (ref 0.0–3.0)
Eosinophils Absolute: 0.1 10*3/uL (ref 0.0–0.7)
Eosinophils Relative: 2.6 % (ref 0.0–5.0)
HCT: 44.4 % (ref 36.0–46.0)
Hemoglobin: 14.5 g/dL (ref 12.0–15.0)
Lymphocytes Relative: 30 % (ref 12.0–46.0)
Lymphs Abs: 1.7 10*3/uL (ref 0.7–4.0)
MCHC: 32.8 g/dL (ref 30.0–36.0)
MCV: 88.5 fl (ref 78.0–100.0)
Monocytes Absolute: 0.4 10*3/uL (ref 0.1–1.0)
Monocytes Relative: 6.6 % (ref 3.0–12.0)
Neutro Abs: 3.4 10*3/uL (ref 1.4–7.7)
Neutrophils Relative %: 59.9 % (ref 43.0–77.0)
Platelets: 295 10*3/uL (ref 150.0–400.0)
RBC: 5.01 Mil/uL (ref 3.87–5.11)
RDW: 14.5 % (ref 11.5–15.5)
WBC: 5.7 10*3/uL (ref 4.0–10.5)

## 2023-12-01 LAB — HEMOGLOBIN A1C
A1c: 5.8
EGFR: 99.7
Hgb A1c MFr Bld: 5.8 % (ref 4.6–6.5)

## 2023-12-01 NOTE — Assessment & Plan Note (Signed)
 She experiences persistent vertigo and diplopia with nystagmus. Meclizine is ineffective.  Differentials include thyroid disease, myasthenia gravis, tumor, diabetes. Will check stat MRI of head along with CMP, CBC, A1c, AChr antibodies, and TSH. Referral placed to neurology. Recent EKG was normal. Advise against traveling and provide a work excuse note. Discussed ER precautions. Follow-up in 3 weeks.

## 2023-12-01 NOTE — Progress Notes (Signed)
 Established Patient Office Visit  Subjective   Patient ID: Erika Mercer, female    DOB: 07/04/1971  Age: 53 y.o. MRN: [id number]  Chief Complaint  Patient presents with   Dizziness    Follow up, went to eye dr yesterday    HPI  Discussed the use of AI scribe software for clinical note transcription with the patient, who gave verbal consent to proceed.  History of Present Illness   The patient, with a history of depression and obesity, presents with a two-week history of persistent dizziness and double vision. The dizziness is described as a spinning sensation, particularly noticeable when lying down. The patient also reports feeling unsteady when walking. The double vision is constant and interferes with the patient's ability to focus, making tasks such as reading and fine-tuning work difficult. The patient sought medical attention at urgent care centers twice and was diagnosed with vertigo. However, the prescribed medication, meclizine, did not alleviate the symptoms. An eye examination revealed double vision and astigmatism, which could be corrected with prism glasses. The patient's blood pressure has been slightly elevated recently, but there are no other new symptoms or changes in health. The patient denies any weakness, eye pain, slurred speech, confusion, chest pain, or shortness of breath.        ROS See pertinent positives and negatives per HPI.    Objective:     BP (!) 142/96 (BP Location: Left Arm, Cuff Size: Large)   Pulse 68   Temp (!) 97.1 F (36.2 C)   Ht 5\' 6"  (1.676 m)   Wt 278 lb 9.6 oz (126.4 kg)   SpO2 99%   BMI 44.97 kg/m    Physical Exam Vitals and nursing note reviewed.  Constitutional:      General: She is not in acute distress.    Appearance: Normal appearance.  HENT:     Head: Normocephalic.     Right Ear: Tympanic membrane, ear canal and external ear normal.     Left Ear: Tympanic membrane, ear canal and external ear normal.  Eyes:      Extraocular Movements:     Right eye: Nystagmus present.     Left eye: Nystagmus present.     Conjunctiva/sclera: Conjunctivae normal.     Pupils: Pupils are equal, round, and reactive to light.  Cardiovascular:     Rate and Rhythm: Normal rate and regular rhythm.     Pulses: Normal pulses.     Heart sounds: Normal heart sounds.  Pulmonary:     Effort: Pulmonary effort is normal.     Breath sounds: Normal breath sounds.  Musculoskeletal:     Cervical back: Normal range of motion.  Skin:    General: Skin is warm.  Neurological:     General: No focal deficit present.     Mental Status: She is alert and oriented to person, place, and time.     Cranial Nerves: No cranial nerve deficit.     Motor: No weakness.     Coordination: Coordination normal.     Deep Tendon Reflexes: Reflexes normal.     Comments: Dix-hallpike maneuver negative bilaterally   Psychiatric:        Mood and Affect: Mood normal.        Behavior: Behavior normal.        Thought Content: Thought content normal.        Judgment: Judgment normal.    The 10-year ASCVD risk score (Arnett DK, et al., 2019) is: 1.5%  Assessment & Plan:   Problem List Items Addressed This Visit       Other   Double vision - Primary   She experiences persistent vertigo and diplopia with nystagmus. Meclizine is ineffective.  Differentials include thyroid disease, myasthenia gravis, tumor, diabetes. Will check stat MRI of head along with CMP, CBC, A1c, AChr antibodies, and TSH. Referral placed to neurology. Recent EKG was normal. Advise against traveling and provide a work excuse note. Discussed ER precautions. Follow-up in 3 weeks.       Relevant Orders   AChR Abs with Reflex to [facility]   CBC with Differential/Platelet   Comprehensive metabolic panel   Hemoglobin A1c   TSH   MR Brain W Wo Contrast   Ambulatory referral to Neurology   Elevated blood pressure reading   Her blood pressure is slightly elevated, and she does not  monitor it at home. She should monitor her blood pressure at home or at a pharmacy two to three times a week and reduce salt intake. Follow-up in 3 weeks.      Dizziness   She experiences persistent vertigo and diplopia with nystagmus. Meclizine is ineffective.  Differentials include thyroid disease, myasthenia gravis, tumor, diabetes. Will check stat MRI of head along with CMP, CBC, A1c, AChr antibodies, and TSH. Referral placed to neurology. Recent EKG was normal. Advise against traveling and provide a work excuse note. Discussed ER precautions. Follow-up in 3 weeks.       Relevant Orders   AChR Abs with Reflex to [facility]   CBC with Differential/Platelet   Comprehensive metabolic panel   Hemoglobin A1c   TSH   MR Brain W Wo Contrast   Ambulatory referral to Neurology   Return in about 3 weeks (around 12/22/2023) for nystagmus, dizziness.    Gerre Scull, NP

## 2023-12-01 NOTE — Assessment & Plan Note (Signed)
 Her blood pressure is slightly elevated, and she does not monitor it at home. She should monitor her blood pressure at home or at a pharmacy two to three times a week and reduce salt intake. Follow-up in 3 weeks.

## 2023-12-01 NOTE — Patient Instructions (Signed)
 It was great to see you!  We are checking your labs today and will let you know the results via mychart/phone.   I have ordered a MRI  I placed a referral to neurology  Let's follow-up in 3 weeks   Take care,  Rodman Pickle, NP

## 2023-12-02 ENCOUNTER — Encounter: Payer: Self-pay | Admitting: Nurse Practitioner

## 2023-12-02 LAB — TSH: TSH: 3 u[IU]/mL (ref 0.35–5.50)

## 2023-12-05 ENCOUNTER — Ambulatory Visit (HOSPITAL_BASED_OUTPATIENT_CLINIC_OR_DEPARTMENT_OTHER)
Admission: RE | Admit: 2023-12-05 | Discharge: 2023-12-05 | Disposition: A | Discharge status: Home / Self Care | Source: Ambulatory Visit | Attending: Nurse Practitioner | Admitting: Nurse Practitioner

## 2023-12-05 DIAGNOSIS — R42 Dizziness and giddiness: Secondary | ICD-10-CM | POA: Insufficient documentation

## 2023-12-05 DIAGNOSIS — H532 Diplopia: Secondary | ICD-10-CM | POA: Diagnosis present

## 2023-12-05 MED ORDER — GADOBUTROL 1 MMOL/ML IV SOLN
10.0000 mL | Freq: Once | INTRAVENOUS | Status: AC | PRN
  Administered 2023-12-05: 10 mL via INTRAVENOUS

## 2023-12-07 ENCOUNTER — Ambulatory Visit (INDEPENDENT_AMBULATORY_CARE_PROVIDER_SITE_OTHER): Admitting: Audiology

## 2023-12-07 ENCOUNTER — Ambulatory Visit (INDEPENDENT_AMBULATORY_CARE_PROVIDER_SITE_OTHER): Admitting: Neurology

## 2023-12-07 ENCOUNTER — Encounter: Payer: Self-pay | Admitting: Neurology

## 2023-12-07 ENCOUNTER — Institutional Professional Consult (permissible substitution) (INDEPENDENT_AMBULATORY_CARE_PROVIDER_SITE_OTHER)

## 2023-12-07 VITALS — BP 130/81 | HR 76 | Ht 66.0 in | Wt 277.0 lb

## 2023-12-07 DIAGNOSIS — H814 Vertigo of central origin: Secondary | ICD-10-CM

## 2023-12-07 DIAGNOSIS — R9082 White matter disease, unspecified: Secondary | ICD-10-CM | POA: Diagnosis not present

## 2023-12-07 DIAGNOSIS — H5509 Other forms of nystagmus: Secondary | ICD-10-CM | POA: Diagnosis not present

## 2023-12-07 DIAGNOSIS — H532 Diplopia: Secondary | ICD-10-CM

## 2023-12-07 NOTE — Patient Instructions (Signed)
 Please go to the ER for expedited evaluation (spinal tap, possible high dose steroids) for abnormalities seen on your MRI brain.

## 2023-12-07 NOTE — Progress Notes (Signed)
 [facility]rology [facility] Clinic Note - Initial Visit   Date: 12/07/2023   Erika Mercer MRN: [id number] DOB: [birth year]   Dear Rodman Pickle, NP:  Thank you for your kind referral of Erika Mercer for consultation of double vision and vertigo. Although her history is well known to you, please allow [place] to reiterate it for the purpose of our medical record. The patient was accompanied to the clinic by husband who also provides collateral information.     Erika Mercer is a 53 y.o. right-handed female with depression presenting for evaluation of vertigo and double vision.   IMPRESSION/PLAN: This is a 53 year-old female presenting with subacute onset of vertigo and diplopia, which is constant.  MRI brain shows cortical white matter changes, including two fainter areas that are periventricular, and most notably hyperintensity involving bilateral facial colliculus, where there is enhancement.  I recommend that patient get admitted for expedited work-up with CSF testing to evaluate for demyelinating disease and treat accordingly.  She will plan to go to the emergency department tomorrow morning.  Follow-up with me in 3-4 weeks.   ------------------------------------------------------------- History of present illness: Starting around 3/13, she woke up with dizziness, described as room-spinning.  Symptoms are constant and worse when she looks to her periphery and significantly worse when she is laying down.  Soon after, she also has double vision with images side-by-side, which is improved if she closes one eye.  Double vision is constant.  She has mild instability, no falls.   No nausea, vomiting, or headaches.  She tried meclizine, but did not have any benefit so stopped it.   She went to her optometrist on 3/25 who diagnosed her with double vision and recommended prisms, which improved her double vision.  She has prisms ordered, which will arrive in two weeks.  She was also told she  has nystagmus.    No problems with speech, swallow, or limb weakness.  No facial numbness/tingling.    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  Nonsmoker, does not drink alcohol.  She recent infection, insect bites, or illness.    Out-side paper records, electronic medical record, and images have been reviewed where available and summarized as:  MRI brain wwo contrast 12/05/2023: T2 hyperintensity and enhancement at the level of the bilateral facial collicului. 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 Pending on clinical workup, have low threshold for imaging follow-up given the somewhat unusual pattern.   Lab Results  Component Value Date   HGBA1C 5.9 07/13/2023    Past Medical History:  Diagnosis Date   Anxiety    Depression     Past Surgical History:  Procedure Laterality Date   CESAREAN SECTION  03/03/1995   x2     Medications:  Outpatient Encounter Medications as of 12/07/2023  Medication Sig   meclizine (ANTIVERT) 12.5 MG tablet Take 1 tablet (12.5 mg total) by mouth 3 (three) times daily as needed for dizziness. (Patient not taking: Reported on 12/07/2023)   sertraline (ZOLOFT) 25 MG tablet Take 1 tablet (25 mg total) by mouth daily. (Patient not taking: Reported on 12/07/2023)   No facility-administered encounter medications on file as of 12/07/2023.    Allergies:  Allergies  Allergen Reactions   Promethazine Anxiety    Restless leg    Family History: Family History  Problem Relation Age of Onset   Healthy Mother    Cancer Father  prostate, leukemia   Diabetes Father    Intellectual disability Sister        down syndrome   Asthma Brother    ADD / ADHD Maternal Uncle    Breast cancer Maternal Grandmother    Cancer Maternal Grandmother        breast   ADD / ADHD Son     Social History: Social History   Tobacco Use   Smoking  status: Never   Smokeless tobacco: Never  Vaping Use   Vaping status: Never Used  Substance Use Topics   Alcohol use: Not Currently   Drug use: Never   Social History   Social History Narrative   Are you right handed or left handed? Right Handed    Are you currently employed ? 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 No    Who lives with you? Husband - Ron    What type of home do you live in: 1 story or 2 story? Lives in a 3 story home        Vital Signs:  BP 130/81   Pulse 76   Ht 5\' 6"  (1.676 m)   Wt 277 lb (125.6 kg)   SpO2 99%   BMI 44.71 kg/m    Neurological Exam: MENTAL STATUS including orientation to time, place, person, recent and remote memory, attention span and concentration, language, and fund of knowledge is normal.  Speech is not dysarthric.  CRANIAL NERVES: II:  No visual field defects.     III-IV-VI: Pupils equal round and reactive to light.  Gaze is restricted with lateral gaze bilaterally and there is non-fatigable horizontal nystagmus.  Medial gaze and vertical gaze is intact.  No ptosis.   V:  Normal facial sensation.    VII:  Normal facial symmetry and movements.   VIII:  Normal hearing and vestibular function.   IX-X:  Normal palatal movement.   XI:  Normal shoulder shrug and head rotation.   XII:  Normal tongue strength and range of motion, no deviation or fasciculation.  MOTOR:  No atrophy, fasciculations or abnormal movements.  No pronator drift.   Upper Extremity:  Right  Left  Deltoid  5/5   5/5   Biceps  5/5   5/5   Triceps  5/5   5/5   Wrist extensors  5/5   5/5   Wrist flexors  5/5   5/5   Finger extensors  5/5   5/5   Finger flexors  5/5   5/5   Dorsal interossei  5/5   5/5   Abductor pollicis  5/5   5/5   Tone (Ashworth scale)  0  0   Lower Extremity:  Right  Left  Hip flexors  5/5   5/5   Knee flexors  5/5   5/5   Knee extensors  5/5   5/5   Dorsiflexors  5/5   5/5    Plantarflexors  5/5   5/5   Toe extensors  5/5   5/5   Toe flexors  5/5   5/5   Tone (Ashworth scale)  0  0   MSRs:                                           Right  Left brachioradialis 2+  2+  biceps 2+  2+  triceps 2+  2+  patellar 2+  2+  ankle jerk 2+  2+  Hoffman no  no  plantar response down  down   SENSORY:  Normal and symmetric perception of light touch, pinprick, vibration, and temperature. Mild sway with Romberg's testing.   COORDINATION/GAIT: Normal finger-to- nose-finger.  Intact rapid alternating movements bilaterally.   Gait narrow based and stable. Stressed gait intact. She is able to perform tandem gait with mild unsteadiness.     Thank you for allowing me to participate in patient's care.  If I can answer any additional questions, I would be pleased to do so.    Sincerely,    Lorenna Lurry K. Allena Katz, DO

## 2023-12-08 ENCOUNTER — Encounter (HOSPITAL_COMMUNITY): Payer: Self-pay | Admitting: Internal Medicine

## 2023-12-08 ENCOUNTER — Observation Stay (HOSPITAL_COMMUNITY)

## 2023-12-08 ENCOUNTER — Inpatient Hospital Stay (HOSPITAL_COMMUNITY)
Admission: EM | Admit: 2023-12-08 | Discharge: 2023-12-13 | DRG: 059 | Disposition: A | Discharge status: Home / Self Care | Attending: Internal Medicine | Admitting: Internal Medicine

## 2023-12-08 ENCOUNTER — Other Ambulatory Visit: Payer: Self-pay

## 2023-12-08 DIAGNOSIS — Z803 Family history of malignant neoplasm of breast: Secondary | ICD-10-CM

## 2023-12-08 DIAGNOSIS — Z888 Allergy status to other drugs, medicaments and biological substances status: Secondary | ICD-10-CM

## 2023-12-08 DIAGNOSIS — F32A Depression, unspecified: Secondary | ICD-10-CM | POA: Diagnosis present

## 2023-12-08 DIAGNOSIS — G379 Demyelinating disease of central nervous system, unspecified: Secondary | ICD-10-CM | POA: Clinically undetermined

## 2023-12-08 DIAGNOSIS — H5500 Unspecified nystagmus: Secondary | ICD-10-CM | POA: Diagnosis present

## 2023-12-08 DIAGNOSIS — G35 Multiple sclerosis: Principal | ICD-10-CM | POA: Diagnosis present

## 2023-12-08 DIAGNOSIS — Z8042 Family history of malignant neoplasm of prostate: Secondary | ICD-10-CM

## 2023-12-08 DIAGNOSIS — R7303 Prediabetes: Secondary | ICD-10-CM | POA: Diagnosis present

## 2023-12-08 DIAGNOSIS — F419 Anxiety disorder, unspecified: Secondary | ICD-10-CM | POA: Diagnosis present

## 2023-12-08 DIAGNOSIS — I4891 Unspecified atrial fibrillation: Secondary | ICD-10-CM | POA: Diagnosis present

## 2023-12-08 DIAGNOSIS — H532 Diplopia: Secondary | ICD-10-CM | POA: Diagnosis not present

## 2023-12-08 DIAGNOSIS — G2581 Restless legs syndrome: Secondary | ICD-10-CM | POA: Diagnosis present

## 2023-12-08 DIAGNOSIS — R42 Dizziness and giddiness: Secondary | ICD-10-CM | POA: Diagnosis not present

## 2023-12-08 DIAGNOSIS — E66813 Obesity, class 3: Secondary | ICD-10-CM | POA: Diagnosis present

## 2023-12-08 DIAGNOSIS — Z8279 Family history of other congenital malformations, deformations and chromosomal abnormalities: Secondary | ICD-10-CM

## 2023-12-08 DIAGNOSIS — Z6841 Body Mass Index (BMI) 40.0 and over, adult: Secondary | ICD-10-CM

## 2023-12-08 DIAGNOSIS — G939 Disorder of brain, unspecified: Secondary | ICD-10-CM

## 2023-12-08 DIAGNOSIS — Z825 Family history of asthma and other chronic lower respiratory diseases: Secondary | ICD-10-CM

## 2023-12-08 DIAGNOSIS — R001 Bradycardia, unspecified: Secondary | ICD-10-CM | POA: Diagnosis present

## 2023-12-08 DIAGNOSIS — Z818 Family history of other mental and behavioral disorders: Secondary | ICD-10-CM

## 2023-12-08 DIAGNOSIS — Z806 Family history of leukemia: Secondary | ICD-10-CM

## 2023-12-08 DIAGNOSIS — Z833 Family history of diabetes mellitus: Secondary | ICD-10-CM

## 2023-12-08 HISTORY — DX: Unspecified atrial fibrillation: I48.91

## 2023-12-08 LAB — CBC WITH DIFFERENTIAL/PLATELET
Abs Immature Granulocytes: 0.02 10*3/uL (ref 0.00–0.07)
Basophils Absolute: 0.1 10*3/uL (ref 0.0–0.1)
Basophils Relative: 1 %
Eosinophils Absolute: 0.1 10*3/uL (ref 0.0–0.5)
Eosinophils Relative: 1 %
HCT: 44.4 % (ref 36.0–46.0)
Hemoglobin: 14.3 g/dL (ref 12.0–15.0)
Immature Granulocytes: 0 %
Lymphocytes Relative: 32 %
Lymphs Abs: 2.5 10*3/uL (ref 0.7–4.0)
MCH: 28.7 pg (ref 26.0–34.0)
MCHC: 32.2 g/dL (ref 30.0–36.0)
MCV: 89 fL (ref 80.0–100.0)
Monocytes Absolute: 0.6 10*3/uL (ref 0.1–1.0)
Monocytes Relative: 8 %
Neutro Abs: 4.5 10*3/uL (ref 1.7–7.7)
Neutrophils Relative %: 58 %
Platelets: 328 10*3/uL (ref 150–400)
RBC: 4.99 MIL/uL (ref 3.87–5.11)
RDW: 14.1 % (ref 11.5–15.5)
WBC: 7.7 10*3/uL (ref 4.0–10.5)
nRBC: 0 % (ref 0.0–0.2)

## 2023-12-08 LAB — HEPATITIS PANEL, ACUTE
HCV Ab: NONREACTIVE
Hep A IgM: NONREACTIVE
Hep B C IgM: NONREACTIVE
Hepatitis B Surface Ag: NONREACTIVE

## 2023-12-08 LAB — COMPREHENSIVE METABOLIC PANEL WITH GFR
ALT: 21 U/L (ref 0–44)
AST: 22 U/L (ref 15–41)
Albumin: 3.8 g/dL (ref 3.5–5.0)
Alkaline Phosphatase: 38 U/L (ref 38–126)
Anion gap: 9 (ref 5–15)
BUN: 11 mg/dL (ref 6–20)
CO2: 22 mmol/L (ref 22–32)
Calcium: 8.8 mg/dL — ABNORMAL LOW (ref 8.9–10.3)
Chloride: 108 mmol/L (ref 98–111)
Creatinine, Ser: 0.79 mg/dL (ref 0.44–1.00)
GFR, Estimated: >60 mL/min (ref >60)
Glucose, Bld: 101 mg/dL — ABNORMAL HIGH (ref 70–99)
Potassium: 3.7 mmol/L (ref 3.5–5.1)
Sodium: 139 mmol/L (ref 135–145)
Total Bilirubin: 0.7 mg/dL (ref 0.0–1.2)
Total Protein: 6.5 g/dL (ref 6.5–8.1)

## 2023-12-08 LAB — MAGNESIUM: Magnesium: 2.1 mg/dL (ref 1.7–2.4)

## 2023-12-08 LAB — SEDIMENTATION RATE: Sed Rate: 6 mm/h (ref 0–22)

## 2023-12-08 LAB — HIV ANTIBODY (ROUTINE TESTING W REFLEX): HIV Screen 4th Generation wRfx: NONREACTIVE

## 2023-12-08 LAB — C-REACTIVE PROTEIN: CRP: 0.7 mg/dL (ref <1.0)

## 2023-12-08 LAB — TSH: TSH: 4.155 u[IU]/mL (ref 0.350–4.500)

## 2023-12-08 MED ORDER — ONDANSETRON HCL 4 MG PO TABS
4.0000 mg | ORAL_TABLET | Freq: Four times a day (QID) | ORAL | Status: DC | PRN
Start: 2023-12-08 — End: 2023-12-13

## 2023-12-08 MED ORDER — LACTATED RINGERS IV BOLUS
1000.0000 mL | Freq: Once | INTRAVENOUS | Status: AC
  Administered 2023-12-08: 1000 mL via INTRAVENOUS

## 2023-12-08 MED ORDER — ENOXAPARIN SODIUM 60 MG/0.6ML IJ SOSY
60.0000 mg | PREFILLED_SYRINGE | INTRAMUSCULAR | Status: DC
  Filled 2023-12-08 (×3): qty 0.6

## 2023-12-08 MED ORDER — ACETAMINOPHEN 325 MG PO TABS: 650.0000 mg | ORAL_TABLET | Freq: Four times a day (QID) | ORAL | Status: DC | PRN

## 2023-12-08 MED ORDER — ALBUTEROL SULFATE (2.5 MG/3ML) 0.083% IN NEBU: 2.5000 mg | INHALATION_SOLUTION | Freq: Four times a day (QID) | RESPIRATORY_TRACT | Status: DC | PRN

## 2023-12-08 MED ORDER — ACETAMINOPHEN 650 MG RE SUPP: 650.0000 mg | Freq: Four times a day (QID) | RECTAL | Status: DC | PRN

## 2023-12-08 MED ORDER — ONDANSETRON HCL 4 MG/2ML IJ SOLN: 4.0000 mg | Freq: Four times a day (QID) | INTRAMUSCULAR | Status: DC | PRN

## 2023-12-08 MED ORDER — ENOXAPARIN SODIUM 60 MG/0.6ML IJ SOSY: 60.0000 mg | PREFILLED_SYRINGE | INTRAMUSCULAR | Status: DC

## 2023-12-08 MED ORDER — SODIUM CHLORIDE 0.9% FLUSH
3.0000 mL | Freq: Two times a day (BID) | INTRAVENOUS | Status: DC
  Administered 2023-12-09 – 2023-12-13 (×9): 3 mL via INTRAVENOUS

## 2023-12-08 MED ORDER — GADOBUTROL 1 MMOL/ML IV SOLN
10.0000 mL | Freq: Once | INTRAVENOUS | Status: AC | PRN
  Administered 2023-12-08: 10 mL via INTRAVENOUS

## 2023-12-08 NOTE — H&P (Addendum)
 History and Physical    Patient: Erika Mercer OZH:[id number] DOB: 1971-01-11 DOA: 12/08/2023 DOS: the patient was seen and examined on 12/08/2023 PCP: Gerre Scull, NP  Patient coming from: Home  Chief Complaint:  Chief Complaint  Patient presents with   Abnormal MRI result   HPI: Erika Mercer is a 53 y.o. female with medical history significant of anxiety, depression, and morbid obesity presents after having abnormal MRI.  She had been experiencing the sensation of room spinning and double vision since March 13. Vertigo symptoms have been intermittent, but double vision has been constant.  Patient also mentions having double vision  No recent illnesses, fevers, chills, headaches, palpitations, falls, or injuries. No issues with coordination, focal weakness, neck pain, or changes in sensation.  She had been seen at urgent care who prescribed meclizine without improvement in symptoms.  After taking meclizine patient only reported having increased tiredness.  She has no history of smoking or alcohol use, and she does not use CPAP or similar devices for sleep.  She was seen by her primary on 3/26 for her symptoms where workup was started including MRI of the brain and referral to neurology. MRI of the brain with and without contrast had been obtained on 3/30 which noted T2 hypersensitivity enhancement at the level of the bilateral facial collicului, and remote white matter insults in the bilateral hemispheres to mild degree suggestive of active demyelinating disease.  She was advised to come to the ED after imaging had been read.  In the ED patient was noted to be afebrile with blood pressures elevated up to 150/78, and all other vital signs maintained.  Labs including CBC BMP were relatively unremarkable.  ED provider unable to perform lumbar puncture and therefore ordered with IR.  Neurology had been formally consulted.  Patient had been given 1 L of lactated Ringer's.  CSF studies were  ordered.   Review of Systems: As mentioned in the history of present illness. All other systems reviewed and are negative. Past Medical History:  Diagnosis Date   Anxiety    Depression    Past Surgical History:  Procedure Laterality Date   CESAREAN SECTION  03/03/1995   x2   Social History:  reports that she has never smoked. She has never used smokeless tobacco. She reports that she does not currently use alcohol. She reports that she does not use drugs.  Allergies  Allergen Reactions   Promethazine Anxiety    Restless leg    Family History  Problem Relation Age of Onset   Healthy Mother    Cancer Father        prostate, leukemia   Diabetes Father    Intellectual disability Sister        down syndrome   Asthma Brother    ADD / ADHD Maternal Uncle    Breast cancer Maternal Grandmother    Cancer Maternal Grandmother        breast   ADD / ADHD Son     Prior to Admission medications   Medication Sig Start Date End Date Taking? Authorizing Provider  meclizine (ANTIVERT) 12.5 MG tablet Take 1 tablet (12.5 mg total) by mouth 3 (three) times daily as needed for dizziness. Patient not taking: Reported on 12/07/2023 11/21/23   Wallis Bamberg, PA-C  sertraline (ZOLOFT) 25 MG tablet Take 1 tablet (25 mg total) by mouth daily. Patient not taking: Reported on 12/07/2023 08/19/23   Gerre Scull, NP    Physical Exam: Vitals:  12/08/23 0628 12/08/23 0652  BP: (!) 150/78   Pulse: 79 73  Resp: 18   Temp: (!) 97.5 F (36.4 C)   SpO2: 100% 100%   Constitutional: Middle-aged female currently in no acute distress Eyes: PERRL, lids and conjunctivae normal.  Horizontal nystagmus present. ENMT: Mucous membranes are moist. Normal dentition.  Neck: normal, supple  Respiratory: clear to auscultation bilaterally, no wheezing, no crackles. Normal respiratory effort. No accessory muscle use.  Cardiovascular: Regular rate and rhythm, no murmurs / rubs / gallops. No extremity edema. 2+ pedal  pulses.  Abdomen: no tenderness, no masses palpated. No hepatosplenomegaly. Bowel sounds positive.  Musculoskeletal: no clubbing / cyanosis.   Good ROM, no contractures. Normal muscle tone.  Skin: no rashes, lesions, ulcers. No induration Neurologic: CN 2-12 grossly intact. Sensation intact. Strength 5/5 in all 4.  Psychiatric: Normal judgment and insight. Alert and oriented x 3. Normal mood.   Data Reviewed:  Reviewed labs, imaging, and pertinent records as documented.  Assessment and Plan:  Diplopia and vertigo Suspected demyelinating disease Acute.  Patient presents with complaints of persistent double vision with intermittent vertigo since 3/13.  MRI of the brain obtained from 3/30 noting concern for possible demyelinating disease.  Lumbar puncture was attempted by the ED provider without success. -Admit to a medical telemetry bed -Neurochecks -Follow-up pending studies ordered by neurology -Check MRI of the cervical spine and thoracic spine with and without contrast -Ultrasound-guided lumbar puncture scheduled in a.m. -PT/OT to eval and treat -Appreciate neurology consultative services we will follow-up for any further recommendations  Morbid obesity BMI 44.71 kg/m   DVT prophylaxis: Lovenox Advance Care Planning:   Code Status: Full Code   Consults: Neurology Family Communication: Family updated at bedside  Severity of Illness: The appropriate patient status for this patient is OBSERVATION. Observation status is judged to be reasonable and necessary in order to provide the required intensity of service to ensure the patient's safety. The patient's presenting symptoms, physical exam findings, and initial radiographic and laboratory data in the context of their medical condition is felt to place them at decreased risk for further clinical deterioration. Furthermore, it is anticipated that the patient will be medically stable for discharge from the hospital within 2 midnights  of admission.   Author: Clydie Braun, MD 12/08/2023 9:12 AM  For on call review [email or web address].

## 2023-12-08 NOTE — ED Triage Notes (Signed)
 Patient advised by her neurologist to go to ER for spinal tap / Steroid treatment due to abnormal MRI result taken last 12/05/2023 . She adds  vertigo and nystagmus . No fever or chills .

## 2023-12-08 NOTE — ED Provider Notes (Signed)
 [place] EMERGENCY DEPARTMENT AT [facility] Provider Note   CSN: [id number] Arrival date & time: 12/08/23  3875     History  Chief Complaint  Patient presents with   Abnormal MRI result    Erika Mercer is a 53 y.o. female.  HPI Patient presents for dizziness and diplopia.  Medical history includes anxiety, depression, atrial fibrillation, prediabetes.  She is not on any blood thinner medications.  1 month ago, she was asymptomatic.  Approximately 3 weeks ago, she had onset of dizziness and diplopia.  Symptoms were not relieved by meclizine.  She established care with outpatient neurology.  She underwent outpatient MRI recently which showed cortical white matter changes concerning for demyelinating disease.  She was seen in neurology office yesterday.  She was advised to come to the ED for admission and CSF studies.  Currently, she endorses ongoing dizziness and diplopia.  She reports that the symptoms are worsened with positional changes.  She denies any current nausea.  She denies any areas of numbness or weakness.     Home Medications Prior to Admission medications   Not on File      Allergies    Promethazine    Review of Systems   Review of Systems  Eyes:  Positive for visual disturbance.  Neurological:  Positive for dizziness.  All other systems reviewed and are negative.   Physical Exam Updated Vital Signs BP (!) 150/78 (BP Location: Left Arm)   Pulse 73   Temp (!) 97.5 F (36.4 C)   Resp 18   SpO2 100%  Physical Exam Vitals and nursing note reviewed.  Constitutional:      General: She is not in acute distress.    Appearance: Normal appearance. She is well-developed. She is not ill-appearing, toxic-appearing or diaphoretic.  HENT:     Head: Normocephalic and atraumatic.     Right Ear: External ear normal.     Left Ear: External ear normal.     Nose: Nose normal.     Mouth/Throat:     Mouth: Mucous membranes are moist.  Eyes:     Extraocular  Movements: Extraocular movements intact.     Conjunctiva/sclera: Conjunctivae normal.     Comments: Nystagmus present  Cardiovascular:     Rate and Rhythm: Normal rate and regular rhythm.  Pulmonary:     Effort: Pulmonary effort is normal. No respiratory distress.  Abdominal:     General: There is no distension.     Palpations: Abdomen is soft.     Tenderness: There is no abdominal tenderness.  Musculoskeletal:        General: No swelling. Normal range of motion.     Cervical back: Normal range of motion and neck supple.  Skin:    General: Skin is warm and dry.     Capillary Refill: Capillary refill takes less than 2 seconds.     Coloration: Skin is not jaundiced or pale.  Neurological:     General: No focal deficit present.     Mental Status: She is alert and oriented to person, place, and time.     Cranial Nerves: No cranial nerve deficit.     Sensory: No sensory deficit.     Motor: No weakness.     Coordination: Coordination normal.  Psychiatric:        Mood and Affect: Mood normal.        Behavior: Behavior normal.     ED Results / Procedures / Treatments   Labs (  all labs ordered are listed, but only abnormal results are displayed) Labs Reviewed  COMPREHENSIVE METABOLIC PANEL WITH GFR - Abnormal; Notable for the following components:      Result Value   Glucose, Bld 101 (*)    Calcium 8.8 (*)    All other components within normal limits  CSF CULTURE W GRAM STAIN  CBC WITH DIFFERENTIAL/PLATELET  MAGNESIUM  TSH  CSF CELL COUNT WITH DIFFERENTIAL  PROTEIN AND GLUCOSE, CSF  OLIGOCLONAL BANDS, CSF + SERM  IGG CSF INDEX  DRAW EXTRA CLOT TUBE  MISC [facility] TEST (SEND OUT)  MENINGITIS/ENCEPHALITIS PANEL (CSF)  RPR  QUANTIFERON-TB GOLD PLUS  HIV ANTIBODY (ROUTINE TESTING W REFLEX)  HEPATITIS PANEL, ACUTE    EKG None  Radiology No results found.  Procedures Lumbar Puncture  Date/Time: 12/08/2023 11:27 AM  Performed by: Gloris Manchester, MD Authorized by: Gloris Manchester, MD   Consent:    Consent obtained:  Verbal   Consent given by:  Patient   Risks, benefits, and alternatives were discussed: yes     Risks discussed:  Bleeding, pain, repeat procedure and infection   Alternatives discussed:  Delayed treatment Universal protocol:    Procedure explained and questions answered to patient or proxy's satisfaction: yes     Patient identity confirmed:  Verbally with patient Pre-procedure details:    Procedure purpose:  Diagnostic   Preparation: Patient was prepped and draped in usual sterile fashion   Anesthesia:    Anesthesia method:  Local infiltration   Local anesthetic:  Lidocaine 1% w/o epi Procedure details:    Lumbar space:  L4-L5 interspace   Patient position:  Sitting   Needle gauge:  20   Needle length (in):  2.5   Ultrasound guidance: no     Number of attempts:  1 Post-procedure details:    Puncture site:  Adhesive bandage applied   Procedure completion:  Tolerated well, no immediate complications Comments:     Unsuccessful procedure.     Medications Ordered in ED Medications  enoxaparin (LOVENOX) injection 60 mg (has no administration in time range)  sodium chloride flush (NS) 0.9 % injection 3 mL (3 mLs Intravenous Not Given 12/08/23 0950)  acetaminophen (TYLENOL) tablet 650 mg (has no administration in time range)    Or  acetaminophen (TYLENOL) suppository 650 mg (has no administration in time range)  ondansetron (ZOFRAN) tablet 4 mg (has no administration in time range)    Or  ondansetron (ZOFRAN) injection 4 mg (has no administration in time range)  albuterol (PROVENTIL) (2.5 MG/3ML) 0.083% nebulizer solution 2.5 mg (has no administration in time range)  lactated ringers bolus 1,000 mL (1,000 mLs Intravenous New Bag/Given 12/08/23 1610)    ED Course/ Medical Decision Making/ A&P                                 Medical Decision Making Amount and/or Complexity of Data Reviewed Labs: ordered. Radiology:  ordered.  Risk Decision regarding hospitalization.   This patient presents to the ED for concern of dizziness and diplopia, this involves an extensive number of treatment options, and is a complaint that carries with it a high risk of complications and morbidity.  The differential diagnosis includes demyelinating disorder, CSF infection, myasthenia gravis, other autoimmune disease, vestibular neuritis   Co morbidities that complicate the patient evaluation  anxiety, depression, atrial fibrillation, prediabetes   Additional history obtained:  Additional history obtained from N/A External  records from outside source obtained and reviewed including EMR   Lab Tests:  I Ordered, and personally interpreted labs.  The pertinent results include: Normal hemoglobin, no leukocytosis, normal kidney function, normal electrolytes  Cardiac Monitoring: / EKG:  The patient was maintained on a cardiac monitor.  I personally viewed and interpreted the cardiac monitored which showed an underlying rhythm of: Sinus rhythm   Consultations Obtained:  I requested consultation with the neurologist, Dr. Iver Nestle,  and discussed lab and imaging findings as well as pertinent plan - they recommend: CSF studies.  Will follow in consult.   Problem List / ED Course / Critical interventions / Medication management  Patient presenting for ongoing dizziness and diplopia for the past 3 weeks.  She has been worked up in outpatient neurology office and recently underwent MRI which showed concern of demyelinating disease.  She was instructed to come to the ED for admission and CSF studies.  On assessment, patient is overall well-appearing.  Exam is notable for nystagmus.  She does not have any focal neurologic deficits.  Lab work was ordered.  Neurology was consulted.  Neurology gave recommendations for CSF studies.  I attempted lumbar puncture at bedside.  Unfortunately, this was unsuccessful.  Fluoroscopy guided LP was  ordered.  Patient was admitted for further management.   Social Determinants of Health:  Has access to outpatient care        Final Clinical Impression(s) / ED Diagnoses Final diagnoses:  Diplopia    Rx / DC Orders ED Discharge Orders     None         Gloris Manchester, MD 12/08/23 1128

## 2023-12-08 NOTE — Progress Notes (Signed)
   12/08/23 1418  TOC Brief Assessment  Insurance and Status Reviewed  Patient has primary care physician Yes  Home environment has been reviewed spouse  Prior level of function: independent  Prior/Current Home Services No current home services  Social Drivers of Health Review SDOH reviewed no interventions necessary  Readmission risk has been reviewed No  Transition of care needs no transition of care needs at this time     Transition of Care Department [facility]) has reviewed patient and no TOC needs have been identified at this time. We will continue to monitor patient advancement through interdisciplinary progression rounds. If new patient transition needs arise, please place a TOC consult.

## 2023-12-08 NOTE — Consult Note (Signed)
 NEUROLOGY CONSULT NOTE   Date of service: December 08, 2023 Patient Name: Erika Mercer MRN:  [id number] DOB:  [birth year] Chief Complaint: "Dizziness and double vision" Requesting Provider: Clydie Braun, MD  History of Present Illness  Erika Mercer is a 53 y.o. female with hx of anxiety/depression who presents with subacute dizziness, vertigo, and diplopia since 3/13. Pt awoke with room-spinning dizziness. Symptoms constant, worsening on peripheral gaze and lying down. Diplopia worst at peripheries of vision. Diplopia is both horizontal and vertical.   Pt has seen optometrist ([facility]) who was able to correct diplopia using prisms. Prisms ordered but not yet present. Pt had MRI on 3/30, results as noted below. Patient saw neurologist Dr Allena Katz who recommended she come in for CSF testing.   Only additional details provided since her visit to outpatient neurologist yesterday is a history of camping last June. Does not recall any bulls-eye or other rashes. Has multiple pet cats in her home.   No prior recognized episodes of focal neurological symptoms  LKW: 11/17/23  NIHSS components Score: Comment  1a Level of Conscious 0[x]  1[]  2[]  3[]      1b LOC Questions 0[x]  1[]  2[]       1c LOC Commands 0[x]  1[]  2[]       2 Best Gaze 0[]  1[x]  2[]       3 Visual 0[x]  1[]  2[]  3[]      4 Facial Palsy 0[x]  1[]  2[]  3[]      5a Motor Arm - left 0[x]  1[]  2[]  3[]  4[]  UN[]    5b Motor Arm - Right 0[x]  1[]  2[]  3[]  4[]  UN[]    6a Motor Leg - Left 0[x]  1[]  2[]  3[]  4[]  UN[]    6b Motor Leg - Right 0[x]  1[]  2[]  3[]  4[]  UN[]    7 Limb Ataxia 0[x]  1[]  2[]  3[]  UN[]     8 Sensory 0[x]  1[]  2[]  UN[]      9 Best Language 0[x]  1[]  2[]  3[]      10 Dysarthria 0[x]  1[]  2[]  UN[]      11 Extinct. and Inattention 0[x]  1[]  2[]       TOTAL:       ROS  Comprehensive ROS performed and pertinent positives documented in HPI    Past History   Past Medical History:  Diagnosis Date   Anxiety    Depression     Past Surgical History:   Procedure Laterality Date   CESAREAN SECTION  03/03/1995   x2    Family History: Family History  Problem Relation Age of Onset   Healthy Mother    Cancer Father        prostate, leukemia   Diabetes Father    Intellectual disability Sister        down syndrome   Asthma Brother    ADD / ADHD Maternal Uncle    Breast cancer Maternal Grandmother    Cancer Maternal Grandmother        breast   ADD / ADHD Son     Social History  reports that she has never smoked. She has never used smokeless tobacco. She reports that she does not currently use alcohol. She reports that she does not use drugs.  Allergies  Allergen Reactions   Promethazine Anxiety    Restless leg    Medications   Current Facility-Administered Medications:    acetaminophen (TYLENOL) tablet 650 mg, 650 mg, Oral, Q6H PRN **OR** acetaminophen (TYLENOL) suppository 650 mg, 650 mg, Rectal, Q6H PRN, Smith, Rondell A, MD   albuterol (PROVENTIL) (2.5 MG/3ML) 0.083% nebulizer solution 2.5  mg, 2.5 mg, Nebulization, Q6H PRN, Katrinka Blazing, Rondell A, MD   enoxaparin (LOVENOX) injection 40 mg, 40 mg, Subcutaneous, Q24H, Smith, Rondell A, MD   ondansetron (ZOFRAN) tablet 4 mg, 4 mg, Oral, Q6H PRN **OR** ondansetron (ZOFRAN) injection 4 mg, 4 mg, Intravenous, Q6H PRN, Smith, Rondell A, MD   sodium chloride flush (NS) 0.9 % injection 3 mL, 3 mL, Intravenous, Q12H, Smith, Rondell A, MD No current outpatient medications on file.  Vitals   Vitals:   12-17-23 0628 Dec 17, 2023 0652  BP: (!) 150/78   Pulse: 79 73  Resp: 18   Temp: (!) 97.5 F (36.4 C)   SpO2: 100% 100%    There is no height or weight on file to calculate BMI.  Physical Exam   Constitutional: Appears well-developed and well-nourished.  Psych: Affect appropriate to situation.  Eyes: No scleral injection.  HENT: No OP obstruction.  Head: Normocephalic. Atraumatic. Cardiovascular: Normal rate and regular rhythm.  Respiratory: Effort normal, non-labored breathing.   GI: Soft.  No distension. There is no tenderness.  Skin: WDI.   Neurologic Examination   Neuro: Mental Status: Alert, oriented x4 Cranial Nerves:  II, III, IV, VI: PERRL, no APD. Horizontal nystagmus present on lateral gaze bilaterally w/ mild abducens palsy bilaterally. Patient reports subjective ptosis not easily appreciated on exam V: facial sensation intact VII: facial muscles intact VIII: grossly intact XI: shoulder shrug intact XII: normal tongue protrusion and movement Motor: 5/5 strength in BL UE, 5/5 in BL LE Sensory: intact in BL UE and BL LE. Vibration 10 sec at toes bilaterally. No length dependent loss of temperature sensation Coordination: intact finger to nose, heel to shin and mirror testing   Labs/Imaging/Neurodiagnostic studies   CBC:  Recent Labs  Lab 2023/12/17 0647  WBC 7.7  NEUTROABS 4.5  HGB 14.3  HCT 44.4  MCV 89.0  PLT 328   Basic Metabolic Panel:  Lab Results  Component Value Date   NA 139 2023/12/17   K 3.7 17-Dec-2023   CO2 22 17-Dec-2023   GLUCOSE 101 (H) December 17, 2023   BUN 11 12/17/23   CREATININE 0.79 Dec 17, 2023   CALCIUM 8.8 (L) December 17, 2023   GFRNONAA >60 December 17, 2023   Lipid Panel:  Lab Results  Component Value Date   LDLCALC 69 07/13/2023   HgbA1c:  Lab Results  Component Value Date   HGBA1C 5.9 07/13/2023   Urine Drug Screen: No results found for: "LABOPIA", "COCAINSCRNUR", "LABBENZ", "AMPHETMU", "THCU", "LABBARB"  Alcohol Level No results found for: "ETH" INR No results found for: "INR" APTT No results found for: "APTT" AED levels: No results found for: "PHENYTOIN", "ZONISAMIDE", "LAMOTRIGINE", "LEVETIRACETA"  MRI Brain(Personally reviewed): T2 hyperintensity and enhancement at the level of the bilateral facial collicului. 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 Pending on clinical workup, have low threshold for imaging follow-up given the  somewhat unusual pattern.  Neurodiagnostics Awaiting results of lumbar puncture.   ASSESSMENT   Erika Mercer is a 53 y.o. female who presents to the hospital for follow up on a 3 week history of subacute diplopia and vertigo. The symptoms are specific and limited to visual diplopia, nystagmus, and some room-spinning vertigo. Symptoms are not progressing and not improving. The MRI from 3/30 showed T2 hyperintensity/enhancement in facial colliculi. At this time, low suspicion for toxic/metabolic or malignant process. Keeping the differential open for either infectious or demyelinating processes. Will need to confirm with CSF testing as indicated below.  Pt is likely to be admitted since the initial lumbar puncture in ED was unsuccessful.  The patient likely meets 2017 revised McDonald criteria with dissemination in time and space based on multiple lesions seen on MRI some of which are enhancing and some of which are not. Characteristic areas of MRI of lesions in this patient include cortical and infratentorial but given somewhat atypical location / appearance of current lesion, will obtain CSF   RECOMMENDATIONS   Recommendations: -MRI C-spine, T-spine with and without contrast -Lumbar puncture: Cell count/differential (tubes 1 and 4), glucose, protein, oligoclonal bands, IgG index, bacterial culture, Lyme -Labs to consider: ESR, CRP, ANA, dsDNA, C3/C4, SSA/SSB, antiphospholipid antibodies, NMO IgG, vitamin D, celiac screening  -Additional disease modifying treatment safety labs: HBV/HCV (hepatitis panel), TB testing, HIV testing, RPR -IV Solu-Medrol 1 g IV daily for 3 -5 days, may complete infusions on an outpatient basis if tolerating well or take 1250 mg prednisone by mouth [facility] 2017) per patient preference -Outpatient follow-up with Dr. Allena Katz  ______________________________________________________________________  Signed: Christie Nottingham, MD [facility],  PGY-1 12/08/2023 10:00 AM  Attending Neurologist's note:  I personally saw this patient, gathering history, performing a full neurologic examination, reviewing relevant labs, personally reviewing relevant imaging including MRI brain, and formulated the assessment and plan, adding the note above for completeness and clarity to accurately reflect my thoughts   Brooke Dare MD-PhD [facility] [phone] Available 7 AM to 7 PM, outside these hours please contact Neurologist on call listed on AMION

## 2023-12-09 ENCOUNTER — Observation Stay (HOSPITAL_COMMUNITY)

## 2023-12-09 DIAGNOSIS — H532 Diplopia: Secondary | ICD-10-CM | POA: Diagnosis not present

## 2023-12-09 LAB — CBC
HCT: 41.8 % (ref 36.0–46.0)
Hemoglobin: 13.9 g/dL (ref 12.0–15.0)
MCH: 29 pg (ref 26.0–34.0)
MCHC: 33.3 g/dL (ref 30.0–36.0)
MCV: 87.3 fL (ref 80.0–100.0)
Platelets: 278 10*3/uL (ref 150–400)
RBC: 4.79 MIL/uL (ref 3.87–5.11)
RDW: 14.1 % (ref 11.5–15.5)
WBC: 6.4 10*3/uL (ref 4.0–10.5)
nRBC: 0 % (ref 0.0–0.2)

## 2023-12-09 LAB — CSF CELL COUNT WITH DIFFERENTIAL
RBC Count, CSF: 8 /mm3 — ABNORMAL HIGH
Tube #: 1
WBC, CSF: 2 /mm3 (ref 0–5)

## 2023-12-09 LAB — SJOGRENS SYNDROME-A EXTRACTABLE NUCLEAR ANTIBODY: SSA (Ro) (ENA) Antibody, IgG: <0.2 AI (ref 0.0–0.9)

## 2023-12-09 LAB — MENINGITIS/ENCEPHALITIS PANEL (CSF)

## 2023-12-09 LAB — BASIC METABOLIC PANEL WITH GFR
Anion gap: 9 (ref 5–15)
BUN: 10 mg/dL (ref 6–20)
CO2: 25 mmol/L (ref 22–32)
Calcium: 9.2 mg/dL (ref 8.9–10.3)
Chloride: 106 mmol/L (ref 98–111)
Creatinine, Ser: 0.78 mg/dL (ref 0.44–1.00)
GFR, Estimated: >60 mL/min (ref >60)
Glucose, Bld: 104 mg/dL — ABNORMAL HIGH (ref 70–99)
Potassium: 4.6 mmol/L (ref 3.5–5.1)
Sodium: 140 mmol/L (ref 135–145)

## 2023-12-09 LAB — SJOGRENS SYNDROME-B EXTRACTABLE NUCLEAR ANTIBODY: SSB (La) (ENA) Antibody, IgG: <0.2 AI (ref 0.0–0.9)

## 2023-12-09 LAB — ANTI-DNA ANTIBODY, DOUBLE-STRANDED: ds DNA Ab: <1 [IU]/mL (ref 0–9)

## 2023-12-09 LAB — ANA W/REFLEX IF POSITIVE: Anti Nuclear Antibody (ANA): NEGATIVE

## 2023-12-09 LAB — RPR: RPR Ser Ql: NONREACTIVE

## 2023-12-09 LAB — PROTEIN AND GLUCOSE, CSF
Glucose, CSF: 59 mg/dL (ref 40–70)
Total  Protein, CSF: 25 mg/dL (ref 15–45)

## 2023-12-09 MED ORDER — SODIUM CHLORIDE 0.9 % IV SOLN
1000.0000 mg | INTRAVENOUS | Status: DC
  Administered 2023-12-09 – 2023-12-12 (×4): 1000 mg via INTRAVENOUS
  Filled 2023-12-09 (×6): qty 16

## 2023-12-09 MED ORDER — PANTOPRAZOLE SODIUM 40 MG IV SOLR
40.0000 mg | INTRAVENOUS | Status: AC
  Administered 2023-12-09 – 2023-12-11 (×3): 40 mg via INTRAVENOUS
  Filled 2023-12-09 (×3): qty 10

## 2023-12-09 MED ORDER — LIDOCAINE 1 % OPTIME INJ - NO CHARGE
5.0000 mL | Freq: Once | INTRAMUSCULAR | Status: AC
  Administered 2023-12-09: 3 mL via INTRADERMAL

## 2023-12-09 NOTE — Progress Notes (Addendum)
 [facility] Progress Note Patient: Erika Mercer WJX:[id number] DOB: 1971-05-13 DOA: 12/08/2023  DOS: the patient was seen and examined on 12/09/2023  Brief Hospital Course: PMH of anxiety, depression, morbid obesity present to the hospital with complaints of double vision.  Found to have an abnormal MRI concerning for multiple myeloma. Neurology consulted.  Repeat LP.  Now on IV Solu-Medrol. Assessment and Plan: Concern for multiple sclerosis. Diplopia and vertigo. Presents with complaints of double vision as well as nystagmus ongoing since 3/13. MRI of the brain outpatient shows concern for possible demyelinating illness. Lumbar puncture performed, no pleocytosis.  Normal glucose normal protein. Further workup pending. Neurology consult. Currently initiating on IV Solu-Medrol 1 g daily for 3 days. Monitor PT OT evaluation.  Obesity Class 3 There is no height or weight on file to calculate BMI.  Placing the pt at higher risk of poor outcomes.      Subjective: Denies any acute complaint.  Ongoing double vision and dizziness as well as fatigue and tiredness.  Physical Exam: General: in Mild distress, No Rash Cardiovascular: S1 and S2 Present, No Murmur Respiratory: Good respiratory effort, Bilateral Air entry present. No Crackles, No wheezes Abdomen: Bowel Sound present, No tenderness Extremities: No edema Neuro: Alert and oriented x3, no new focal deficit, right-sided nystagmus as well as left eye ophthalmoplegia.  Data Reviewed: I have Reviewed nursing notes, Vitals, and Lab results. Since last encounter, pertinent lab results CBC and BMP   . I have ordered test including CBC and BMP  .  Unresulted Labs (From admission, onward)     Start     Ordered   12/08/23 1252  Complement, total  Once,   R        12/08/23 1252   12/08/23 1252  Antiphospholipid syndrome eval, bld  Once,   R        12/08/23 1252   12/08/23 1252  Neuromyelitis optica autoab, IgG  Once,   R         12/08/23 1252   12/08/23 1251  C3 complement  Once,   R        12/08/23 1252   12/08/23 1251  C4 complement  Once,   R        12/08/23 1252   12/08/23 0804  QuantiFERON-TB Gold Plus  Once,   URGENT        12/08/23 0804   12/08/23 0803  Miscellaneous [facility] test (send-out)  Once,   URGENT       Question:  Test name / description:  Answer:  Lyme Disease, Line Blot, Cerebrospinal Fluid Test Number [facility] [id number]   12/08/23 0802   12/08/23 0801  IgG CSF index  (IgG CSF Index, CSF + Serum panel)  Once,   URGENT        12/08/23 0801   12/08/23 0801  Draw extra clot tube  (IgG CSF Index, CSF + Serum panel)  Once,   URGENT        12/08/23 0801   12/08/23 0757  Oligoclonal bands, CSF + serum  (CSF Labs)  Once,   URGENT        12/08/23 0757            Disposition: Status is: Observation   Family Communication: Needing IV steroids Level of care: Telemetry Medical   Vitals:   12/09/23 0417 12/09/23 0818 12/09/23 1106 12/09/23 1658  BP: 124/67 (!) 121/50 (!) 117/54 (!) 130/59  Pulse: 77 83 (!) 59 62  Resp: 15 18  17 17  Temp: 98.3 F (36.8 C) 97.9 F (36.6 C) 98.1 F (36.7 C) 98.5 F (36.9 C)  TempSrc: Oral Oral Oral Oral  SpO2: 99% 100% 100% 100%     Author: Lynden Oxford, MD 12/09/2023 7:38 PM  Please look on [email or web address] to find out who is on call.

## 2023-12-09 NOTE — Plan of Care (Signed)

## 2023-12-09 NOTE — Procedures (Signed)
 PROCEDURE SUMMARY:  Successful fluoroscopic guided lumbar puncture at the level of L2-3.  Opening pressure was 19.5 cm H2O ~12 mL clear colorless fluid collected and sent for labs.  No immediate complications.  Pt tolerated well.   EBL = none  Please see full dictation in imaging section of Epic for procedure details.  Electronically Signed: Jama Flavors, PA-C 12/09/2023, 9:32 AM

## 2023-12-09 NOTE — Progress Notes (Signed)
 NEUROLOGY CONSULT FOLLOW UP NOTE   Date of service: December 09, 2023 Patient Name: Erika Mercer MRN:  [id number] DOB:  08-08-1971  Interval Hx/subjective   MRI C/T spine showed no lesions or evidence of demyelinating disease. LP pending for today.   On exam, patient is sitting up in bed, no family at bedside. She states that her symptoms are consistent as when she came in, no new complaints.   Discussed steroids and plan of care with patient, all questions answered.  Vitals   Vitals:   12/08/23 1255 12/08/23 1540 12/08/23 1946 12/09/23 0417  BP: (!) 144/73 (!) 151/90 (!) 109/51 124/67  Pulse: 65 66 68 77  Resp: 16 16 16 15   Temp: 97.9 F (36.6 C) (!) 97.5 F (36.4 C) (!) 97.5 F (36.4 C) 98.3 F (36.8 C)  TempSrc: Oral Oral Oral Oral  SpO2: 100% 99% 99% 99%     There is no height or weight on file to calculate BMI.  Physical Exam   Constitutional: Appears well-developed and well-nourished.  Psych: Affect appropriate to situation.  Eyes: No scleral injection.  HENT: No OP obstrucion.  Head: Normocephalic.  Cardiovascular: Normal rate and regular rhythm.  Respiratory: Effort normal, non-labored breathing.  GI: Soft.  No distension. There is no tenderness.  Skin: WDI.   Neurologic Examination   Neuro: Mental Status: Patient is awake, alert, oriented to person, place, month, year, and situation. Patient is able to give a clear and coherent history. No signs of aphasia or neglect Cranial Nerves: II: Visual Fields are full.   III,IV, VI: Horizontal nystagmus continues V: Facial sensation is symmetric to light touvh VII: Facial movement is symmetric.  VIII: hearing is intact to voice X: normal phonation XI: Shoulder shrug is symmetric. XII: tongue protrusion is midline.  Motor: Tone is normal. Bulk is normal. No focal weakness or drifts seen.  5/5 strength throughout.  Sensory: Sensation is symmetric to light touch in the arms and legs. Cerebellar: FNF and HKS  are intact bilaterally  Medications  Current Facility-Administered Medications:    acetaminophen (TYLENOL) tablet 650 mg, 650 mg, Oral, Q6H PRN **OR** acetaminophen (TYLENOL) suppository 650 mg, 650 mg, Rectal, Q6H PRN, Katrinka Blazing, Rondell A, MD   albuterol (PROVENTIL) (2.5 MG/3ML) 0.083% nebulizer solution 2.5 mg, 2.5 mg, Nebulization, Q6H PRN, Katrinka Blazing, Rondell A, MD   enoxaparin (LOVENOX) injection 60 mg, 60 mg, Subcutaneous, Q24H, Smith, Rondell A, MD   ondansetron (ZOFRAN) tablet 4 mg, 4 mg, Oral, Q6H PRN **OR** ondansetron (ZOFRAN) injection 4 mg, 4 mg, Intravenous, Q6H PRN, Smith, Rondell A, MD   sodium chloride flush (NS) 0.9 % injection 3 mL, 3 mL, Intravenous, Q12H, Madelyn Flavors A, MD  Labs and Diagnostic Imaging   CBC:  Recent Labs  Lab 12/08/23 0647  WBC 7.7  NEUTROABS 4.5  HGB 14.3  HCT 44.4  MCV 89.0  PLT 328    Basic Metabolic Panel:  Lab Results  Component Value Date   NA 139 12/08/2023   K 3.7 12/08/2023   CO2 22 12/08/2023   GLUCOSE 101 (H) 12/08/2023   BUN 11 12/08/2023   CREATININE 0.79 12/08/2023   CALCIUM 8.8 (L) 12/08/2023   GFRNONAA >60 12/08/2023   Lipid Panel:  Lab Results  Component Value Date   LDLCALC 69 07/13/2023   HgbA1c:  Lab Results  Component Value Date   HGBA1C 5.9 07/13/2023   MR Cervical & Thoracic Spine (Personally reviewed): No spinal canal stenosis of the cervical or thoracic spine  Mild right C6-7 neural foraminal stenosis   MRI Brain(Personally reviewed): T2 hyperintensity and enhancement at the level of the bilateral facial collicului. 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  LP 4/3: PENDING Meningitis/Encephalitis Panel: Negative CSF Culture: Pending IgG CSF Index: Pending OG bands: Pending Protein:59 Glucose:25 Cell count: non-remarkable  Additional Labs: NMO: Pending Antiphospholipid Syndrome: Pending Quantiferon TB:  Pending Sjogren's: Negative Complement, total: Negative C4 Complement: Negative C3 Complement: Negative Anti-DNA antibody: Negative AChR Antibodies: Negative ANA: Negative  CRP: Negative ESR: Negative RPR: Negative TSH: WNL  Hepatitis Panel: Negative HIV antibody: Negative   Assessment   Erika Mercer is a 53 y.o. female with PMH significant for anxiety/depression who presented 4/2 due to dizziness, vertigo and diplopia since 3/13. She describes these symptoms as constant, worsening with lying down and peripheral gaze, with diplopia being both horizontal and vertical. Patient denied rashes. Endorses camping last year and has multiple cats in her home.   Opthalmology was able to correct diplopia using prisms, which have been ordered by is office but not yet present. MRI completed 3/30, which revealed remote white matter insults favoring active demyelinating disease. Her neurologist, Dr. Allena Katz, recommended that she come in for CSF testing. MRI of C and T spine have not shown any evidence of active demyelinating disease.   Pending LP today under flouro to hone in on more specific differential diagnoses, as it is open to demyelinating processes (like MS) and infectious processes. Patient is afebrile, no other evidence of infection. Multiple serum labs are still pending as well. Now that LP is complete, we can start her on steroids.   Recommendations  - LP under fluoro today - Will follow CSF labs - Start Solu-Medrol 1g daily for 3 days. (ordered) - PPI while on steroids (ordered)  - Outpatient follow-up with Dr. Allena Katz, her current neurologist - Patient should not drive until cleared by her outpatient neurologist ______________________________________________________________________   Pt seen by Neuro NP/APP and later by MD. Note/plan to be edited by MD as needed.    Lynnae January, DNP, AGACNP-BC [facility] Please use AMION for contact information & EPIC for  messaging.  I discussed the case with the nurse practitioner and agree with assessment and plan with the following addition.  Patient does technically meet McDonald criteria of dissemination in time and space for MS based on MRI brain findings.  Know that we have CSF, we will go ahead and start trial of IV Methylpred 1 g daily for 3 days with PPI.  Will follow-up on the rest of CSF labs.  Anibal Henderson, MD [facility]

## 2023-12-09 NOTE — Progress Notes (Signed)
 PT Cancellation Note  Patient Details Name: Tenessa Marsee MRN: [id number] DOB: [birth year]   Cancelled Treatment:    Reason Eval/Treat Not Completed: Active bedrest order after lumbar puncture. MD advised to HOLD for evaluations until tomorrow.     Bevelyn Buckles 12/09/2023, 11:14 AM Anaeli Cornwall M,PT Acute Rehab Services [phone]

## 2023-12-09 NOTE — Progress Notes (Signed)
 OT Cancellation Note  Patient Details Name: Erika Mercer MRN: [id number] DOB: 1970/12/01   Cancelled Treatment:    Reason Eval/Treat Not Completed: Patient not medically ready. Pt completed LP in am. Per MD hold eval till tomorrow.  Ivor Messier, OT  Acute Rehabilitation Services Office [phone] Secure chat preferred   Erika Mercer 12/09/2023, 10:33 AM

## 2023-12-09 NOTE — Progress Notes (Signed)
 PT Cancellation Note  Patient Details Name: Erika Mercer MRN: [id number] DOB: April 23, 1971   Cancelled Treatment:    Reason Eval/Treat Not Completed: Medical issues which prohibited therapy (Pt in procedure. Will check back as time allows.)   Bevelyn Buckles 12/09/2023, 9:07 AM Dre Gamino M,PT Acute Rehab Services [phone]

## 2023-12-10 DIAGNOSIS — R42 Dizziness and giddiness: Secondary | ICD-10-CM | POA: Diagnosis present

## 2023-12-10 DIAGNOSIS — Z888 Allergy status to other drugs, medicaments and biological substances status: Secondary | ICD-10-CM | POA: Diagnosis not present

## 2023-12-10 DIAGNOSIS — G35 Multiple sclerosis: Secondary | ICD-10-CM | POA: Diagnosis present

## 2023-12-10 DIAGNOSIS — H5500 Unspecified nystagmus: Secondary | ICD-10-CM | POA: Diagnosis present

## 2023-12-10 DIAGNOSIS — F32A Depression, unspecified: Secondary | ICD-10-CM | POA: Diagnosis present

## 2023-12-10 DIAGNOSIS — Z803 Family history of malignant neoplasm of breast: Secondary | ICD-10-CM | POA: Diagnosis not present

## 2023-12-10 DIAGNOSIS — Z806 Family history of leukemia: Secondary | ICD-10-CM | POA: Diagnosis not present

## 2023-12-10 DIAGNOSIS — E66813 Obesity, class 3: Secondary | ICD-10-CM | POA: Diagnosis present

## 2023-12-10 DIAGNOSIS — G2581 Restless legs syndrome: Secondary | ICD-10-CM | POA: Diagnosis present

## 2023-12-10 DIAGNOSIS — I4891 Unspecified atrial fibrillation: Secondary | ICD-10-CM | POA: Diagnosis present

## 2023-12-10 DIAGNOSIS — R7303 Prediabetes: Secondary | ICD-10-CM | POA: Diagnosis present

## 2023-12-10 DIAGNOSIS — Z8042 Family history of malignant neoplasm of prostate: Secondary | ICD-10-CM | POA: Diagnosis not present

## 2023-12-10 DIAGNOSIS — Z6841 Body Mass Index (BMI) 40.0 and over, adult: Secondary | ICD-10-CM | POA: Diagnosis not present

## 2023-12-10 DIAGNOSIS — F419 Anxiety disorder, unspecified: Secondary | ICD-10-CM | POA: Diagnosis present

## 2023-12-10 DIAGNOSIS — Z8279 Family history of other congenital malformations, deformations and chromosomal abnormalities: Secondary | ICD-10-CM | POA: Diagnosis not present

## 2023-12-10 DIAGNOSIS — Z825 Family history of asthma and other chronic lower respiratory diseases: Secondary | ICD-10-CM | POA: Diagnosis not present

## 2023-12-10 DIAGNOSIS — Z818 Family history of other mental and behavioral disorders: Secondary | ICD-10-CM | POA: Diagnosis not present

## 2023-12-10 DIAGNOSIS — Z833 Family history of diabetes mellitus: Secondary | ICD-10-CM | POA: Diagnosis not present

## 2023-12-10 DIAGNOSIS — H532 Diplopia: Secondary | ICD-10-CM | POA: Diagnosis present

## 2023-12-10 DIAGNOSIS — R001 Bradycardia, unspecified: Secondary | ICD-10-CM | POA: Diagnosis present

## 2023-12-10 LAB — CBC
HCT: 43.9 % (ref 36.0–46.0)
Hemoglobin: 14.5 g/dL (ref 12.0–15.0)
MCH: 28.5 pg (ref 26.0–34.0)
MCHC: 33 g/dL (ref 30.0–36.0)
MCV: 86.4 fL (ref 80.0–100.0)
Platelets: 326 10*3/uL (ref 150–400)
RBC: 5.08 MIL/uL (ref 3.87–5.11)
RDW: 13.9 % (ref 11.5–15.5)
WBC: 9.3 10*3/uL (ref 4.0–10.5)
nRBC: 0 % (ref 0.0–0.2)

## 2023-12-10 LAB — COMPLEMENT, TOTAL: Compl, Total (CH50): >60 U/mL (ref >41)

## 2023-12-10 LAB — C3 COMPLEMENT: C3 Complement: 169 mg/dL — ABNORMAL HIGH (ref 82–167)

## 2023-12-10 LAB — NEUROMYELITIS OPTICA AUTOAB, IGG: NMO-IgG: <1.5 U/mL (ref 0.0–3.0)

## 2023-12-10 LAB — C4 COMPLEMENT: Complement C4, Body Fluid: 28 mg/dL (ref 12–38)

## 2023-12-10 NOTE — Evaluation (Signed)
 Occupational Therapy Evaluation Patient Details Name: Erika Mercer MRN: [id number] DOB: December 29, 1970 Today's Date: 12/10/2023   History of Present Illness   Pt is a 53 y.o. female admitted 12/08/23 after her neurologist advised her to go to ED for spinal tap d/t abnormal MRI on 3/30. C/o of diplopia, vertigo and nystagmus. Concern for possible demyelinating disease. S/p LP 4/3. PMH: anxiety, morbid obesity     Clinical Impressions PTA Erika Mercer lives independently with her family, drives and works Therapist, sports. Pt currently experiencing diplopia in all directions, however worse when looking R/L. States she is not really dizzy but feels "off" due to her vision deficits. Pt has been fitted with glasses with prisms which improve her vision, however she will not get them until 4/14. Began education regarding use of partial occlusion to improve functional vision and safety until she receives her glasses. Discussed recommendation for follow up with OT at a neuro outpt center if still needed after she receives her prism glasses to facilitate a safe return to work. Pt verbalized understanding. Acute OT will follow to further educate on use of partial occlusion, activities to address depth perception and educate on strategies to reduce risk of falls.      If plan is discharge home, recommend the following:   Assistance with cooking/housework;Assist for transportation     Functional Status Assessment   Patient has had a recent decline in their functional status and demonstrates the ability to make significant improvements in function in a reasonable and predictable amount of time.     Equipment Recommendations   None recommended by OT     Recommendations for Other Services         Precautions/Restrictions   Precautions Precautions: Fall Precaution/Restrictions Comments: visual deficits     Mobility Bed Mobility                     Transfers Overall transfer  level: Independent                        Balance Overall balance assessment: Mild deficits observed, not formally tested                                         ADL either performed or assessed with clinical judgement   ADL Overall ADL's : At baseline                                       General ADL Comments: not currently driving; filing FMLA     Vision Baseline Vision/History: 1 Wears glasses Ability to See in Adequate Light: 0 Adequate Patient Visual Report: Diplopia;Nausea/blurring vision with head movement;Eye fatigue/eye pain/headache Vision Assessment?: Yes Eye Alignment: Impaired (comment) Ocular Range of Motion: Restricted on the left;Restricted on the right;Impaired-to be further tested in functional context Alignment/Gaze Preference: Within Defined Limits Tracking/Visual Pursuits: Decreased smoothness of horizontal tracking;Decreased smoothness of vertical tracking Saccades: Additional head turns occurred during testing;Additional eye shifts occurred during testing;Decreased speed of saccadic movement Convergence: Impaired (comment) Visual Fields: No apparent deficits Diplopia Assessment: Disappears with one eye closed;Objects split side to side;Present all the time/all directions;Other (comment) (with with R/L gaze; objects offset) Depth Perception: Undershoots;Overshoots Additional Comments: Appears R eye dominant; partial occulsion used on nasal portion L lens/double  taped  - diplpopia diminishsed;pt asked about using an eye patch; discussed benefits of using partial occlusion however goal is to diminish eye fatigue/strain and if this is better with an eye patch, then suggest pt trial the eye patch however the patch will need to be alternated q 2 hrs.      Perception Perception: Within Functional Limits       Praxis         Pertinent Vitals/Pain Pain Assessment Pain Assessment: No/denies pain     Extremity/Trunk  Assessment Upper Extremity Assessment Upper Extremity Assessment: Overall WFL for tasks assessed   Lower Extremity Assessment Lower Extremity Assessment: Defer to PT evaluation   Cervical / Trunk Assessment Cervical / Trunk Assessment: Normal   Communication Communication Communication: No apparent difficulties   Cognition Arousal: Alert Behavior During Therapy: WFL for tasks assessed/performed Cognition: No apparent impairments                               Following commands: Intact       Cueing  General Comments          Exercises Exercises: Other exercises Other Exercises Other Exercises: activities to work on depth perception/eye-hand coordiantion: hitting target  on paper wiht pen; placing pen through tape roll without hitting tape roll edge; placing cap on pen Other Exercises: visual tracking wiht head turns/VOR   Shoulder Instructions      Home Living Family/patient expects to be discharged to:: Private residence Living Arrangements: Spouse/significant other;Children Available Help at Discharge: Family;Available PRN/intermittently Type of Home: House Home Access: Stairs to enter [employer] of Steps: 6 Entrance Stairs-Rails: Can reach both Home Layout: Multi-level;1/2 bath on main level     Bathroom Shower/Tub: Producer, television/film/video: Handicapped height Bathroom Accessibility: No   Home Equipment: Agricultural consultant (2 wheels);Shower seat;Electric scooter (Stair lift)          Prior Functioning/Environment Prior Level of Function : Independent/Modified Independent;Driving;Working/employed (works as a Designer, industrial/product)                    OT Problem List: Impaired vision/perception;Decreased coordination;Decreased Engineer, building services   OT Treatment/Interventions: Environmental manager;Therapeutic exercise;Therapeutic activities;Visual/perceptual remediation/compensation;Patient/family education      OT Goals(Current  goals can be found in the care plan section)   Acute Rehab OT Goals Patient Stated Goal: for vision to improve OT Goal Formulation: With patient Time For Goal Achievement: 12/24/23 Potential to Achieve Goals: Good   OT Frequency:  Min 2X/week    Co-evaluation              AM-PAC OT "6 Clicks" Daily Activity     Outcome Measure Help from another person eating meals?: None Help from another person taking care of personal grooming?: None Help from another person toileting, which includes using toliet, bedpan, or urinal?: None Help from another person bathing (including washing, rinsing, drying)?: None Help from another person to put on and taking off regular upper body clothing?: None Help from another person to put on and taking off regular lower body clothing?: None 6 Click Score: 24   End of Session Equipment Utilized During Treatment: Gait belt Nurse Communication: Other (comment) (use of parital occlusion)  Activity Tolerance: Patient tolerated treatment well Patient left: in chair;with call bell/phone within reach;with family/visitor present  OT Visit Diagnosis: Low vision, both eyes (H54.2);Dizziness and giddiness (R42)  Time: 9147-8295 OT Time Calculation (min): 31 min Charges:  OT General Charges $OT Visit: 1 Visit OT Evaluation $OT Eval Moderate Complexity: 1 Mod OT Treatments $Therapeutic Activity: 8-22 mins  Luisa Dago, OT/L   Acute OT Clinical Specialist Acute Rehabilitation Services Pager [phone] Office [phone]   [facility] 12/10/2023, 11:00 AM

## 2023-12-10 NOTE — Hospital Course (Addendum)
 PMH of anxiety, depression, morbid obesity present to the hospital with complaints of double vision.  Found to have an abnormal MRI concerning for multiple myeloma. Neurology consulted.  Repeat LP.  Now on IV Solu-Medrol for a total 5 days. Assessment and Plan: Concern for multiple sclerosis. Diplopia and vertigo. Presents with complaints of double vision as well as nystagmus ongoing since 3/13. MRI of the brain outpatient shows concern for possible demyelinating illness. Lumbar puncture performed, no pleocytosis.  Normal glucose normal protein. Further workup pending. Neurology consult. None neurology recommending IV Solu-Medrol for 5 days. Continue PPI.  Continue CBG twice daily.  PT recommended no therapy. Patient does not have multiple myeloma.   Obesity Class 3 There is no height or weight on file to calculate BMI.  Placing the pt at higher risk of poor outcomes.

## 2023-12-10 NOTE — Progress Notes (Addendum)
 [facility] Progress Note Patient: Erika Mercer AVW:[id number] DOB: July 18, 1971 DOA: 12/08/2023  DOS: the patient was seen and examined on 12/10/2023  Brief Hospital Course: PMH of anxiety, depression, morbid obesity present to the hospital with complaints of double vision.  Found to have an abnormal MRI concerning for multiple myeloma. Neurology consulted.  Repeat LP.  Now on IV Solu-Medrol. Assessment and Plan: Concern for multiple sclerosis. Diplopia and vertigo. Presents with complaints of double vision as well as nystagmus ongoing since 3/13. MRI of the brain outpatient shows concern for possible demyelinating illness. Lumbar puncture performed, no pleocytosis.  Normal glucose normal protein. Further workup pending. Neurology consult. Currently initiating on IV Solu-Medrol 1 g daily for 3 days.  Symptoms unchanged today.  Check CBC.  On PPI. Monitor PT OT evaluation.   Obesity Class 3 There is no height or weight on file to calculate BMI.  Placing the pt at higher risk of poor outcomes.   Subjective: No nausea no vomiting no fever no chills.  No change in symptoms.  Physical Exam: Alert awake and oriented x 3. Pupils are equal and reactive to light. Ophthalmoplegia noted. Nystagmus noted.  Data Reviewed: I have Reviewed nursing notes, Vitals, and Lab results. Since last encounter, pertinent lab results CBC and BMP   . I have ordered test including CBC and BMP  .   Disposition: Status is: Inpatient Remains inpatient appropriate because: Receiving IV steroids  Family Communication: No one at bedside Level of care: Telemetry Medical   Vitals:   12/09/23 2042 12/10/23 0436 12/10/23 0819 12/10/23 1805  BP: (!) 119/58 134/75 132/69 135/74  Pulse: 70 71 72 68  Resp: 17 16 18 18   Temp: 97.6 F (36.4 C) 98.5 F (36.9 C) 98.3 F (36.8 C) 97.9 F (36.6 C)  TempSrc: Oral Oral Oral Oral  SpO2: 96% 97% 98% 98%     Author: Lynden Oxford, MD 12/10/2023 7:23 PM  Please look  on [email or web address] to find out who is on call.

## 2023-12-10 NOTE — Evaluation (Signed)
 Physical Therapy Evaluation and d/c Patient Details Name: Erika Mercer MRN: [id number] DOB: 1970-10-07 Today's Date: 12/10/2023  History of Present Illness  Pt is a 53 y.o. female admitted 12/08/23 after her neurologist advised her to go to ED for spinal tap d/t abnormal MRI on 3/30. C/o of diplopia, vertigo and nystagmus. Concern for possible demyelinating disease. S/p LP 4/3. PMH: anxiety, morbid obesity  Clinical Impression   Pt presents with diplopia and nystagmus with smooth pursuits, otherwise WFL strength, balance, sensation, and tolerance. Pt ambulatory for great hallway distance with independence, does not demonstrate imbalance with challenge though feels unsteady with head turns. Pt mobility aided by use of occlusion glasses (provided by OT). Pt with no further questions, pt independent with mobility and no acute or post-acute PT needs at this time.          If plan is discharge home, recommend the following:     Can travel by private vehicle        Equipment Recommendations None recommended by PT  Recommendations for Other Services       Functional Status Assessment Patient has not had a recent decline in their functional status     Precautions / Restrictions Precautions Precautions: Fall (min risk given visual impairment, but good awareness) Precaution/Restrictions Comments: visual deficits Restrictions Weight Bearing Restrictions Per Provider Order: No      Mobility  Bed Mobility Overal bed mobility: Independent                  Transfers Overall transfer level: Independent                      Ambulation/Gait Ambulation/Gait assistance: Independent Gait Distance (Feet): 500 Feet Assistive device: None Gait Pattern/deviations: WFL(Within Functional Limits) Gait velocity: wfl     General Gait Details: WFL, pt endorsing feeling unsteady with horizontal head turns but balance unaffected  Stairs            Wheelchair Mobility      Tilt Bed    Modified Rankin (Stroke Patients Only)       Balance Overall balance assessment: Modified Independent (WFL horizontal and vertical head turns, fast/slow gait, directional changes, pick up object)                                           Pertinent Vitals/Pain Pain Assessment Pain Assessment: No/denies pain    Home Living Family/patient expects to be discharged to:: Private residence Living Arrangements: Spouse/significant other;Children Available Help at Discharge: Family;Available PRN/intermittently Type of Home: House Home Access: Stairs to enter Entrance Stairs-Rails: Can reach both Entrance Stairs-Number of Steps: 6   Home Layout: Multi-level;1/2 bath on main level Home Equipment: Agricultural consultant (2 wheels);Shower seat;Electric scooter 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    Prior Function Prior Level of Function : Independent/Modified Independent;Driving;Working/employed                     Extremity/Trunk Assessment   Upper Extremity Assessment Upper Extremity Assessment: Defer to OT evaluation    Lower Extremity Assessment Lower Extremity Assessment: Overall WFL for tasks assessed    Cervical / Trunk Assessment Cervical / Trunk Assessment: Normal  Communication   Communication Communication: No apparent difficulties    Cognition Arousal: Alert Behavior During Therapy: WFL for tasks assessed/performed   PT - Cognitive impairments:  No apparent impairments                         Following commands: Intact       Cueing       General Comments General comments (skin integrity, edema, etc.): nystagmus with smooth pursuits toward periphery, direction-changing, and bilat but R>L    Exercises     Assessment/Plan    PT Assessment Patient does not need any further PT services  PT Problem List         PT Treatment Interventions      PT Goals (Current goals  can be found in the Care Plan section)  Acute Rehab PT Goals Patient Stated Goal: home PT Goal Formulation: With patient Time For Goal Achievement: 12/10/23 Potential to Achieve Goals: Good    Frequency       Co-evaluation               AM-PAC PT "6 Clicks" Mobility  Outcome Measure Help needed turning from your back to your side while in a flat bed without using bedrails?: None Help needed moving from lying on your back to sitting on the side of a flat bed without using bedrails?: None Help needed moving to and from a bed to a chair (including a wheelchair)?: None Help needed standing up from a chair using your arms (e.g., wheelchair or bedside chair)?: None Help needed to walk in hospital room?: None Help needed climbing 3-5 steps with a railing? : None 6 Click Score: 24    End of Session   Activity Tolerance: Patient tolerated treatment well Patient left: in chair;with call bell/phone within reach Nurse Communication: Mobility status PT Visit Diagnosis: Dizziness and giddiness (R42)    Time: 1610-9604 PT Time Calculation (min) (ACUTE ONLY): 12 min   Charges:   PT Evaluation $PT Eval Low Complexity: 1 Low   PT General Charges $$ ACUTE PT VISIT: 1 Visit         Marye Round, PT DPT Acute Rehabilitation Services Secure Chat Preferred  Office [phone]   Haylin Camilli E Christain [place] 12/10/2023, 2:26 PM

## 2023-12-10 NOTE — Progress Notes (Addendum)
 NEUROLOGY CONSULT FOLLOW UP NOTE   Date of service: December 10, 2023 Patient Name: Erika Mercer MRN:  [id number] DOB:  Aug 12, 1971  Interval Hx/subjective   No family at the bedside.  Patient is sitting up in the bed in no apparent distress.  She states that her symptoms are the same, no new neurological events overnight She had her first dose of steroids yesterday, today will be day 2 of 3.  She is tolerating the steroids thus far    Vitals   Vitals:   12/09/23 1658 12/09/23 2042 12/10/23 0436 12/10/23 0819  BP: (!) 130/59 (!) 119/58 134/75 132/69  Pulse: 62 70 71 72  Resp: 17 17 16 18   Temp: 98.5 F (36.9 C) 97.6 F (36.4 C) 98.5 F (36.9 C) 98.3 F (36.8 C)  TempSrc: Oral Oral Oral Oral  SpO2: 100% 96% 97% 98%     There is no height or weight on file to calculate BMI.  Physical Exam   Constitutional: Appears well-developed and well-nourished.   Psych: Affect appropriate to situation.   Eyes: No scleral injection.   HENT: No OP obstrucion.   Head: Normocephalic.   Cardiovascular: Normal rate and regular rhythm.   Respiratory: Effort normal, non-labored breathing.   GI: Soft.  No distension. There is no tenderness.   Skin: WDI.    Neurologic Examination    Neuro: Mental Status: Patient is awake, alert, oriented to person, place, month, year, and situation. Patient is able to give a clear and coherent history. No signs of aphasia or neglect Cranial Nerves: II: Visual Fields are full.   III,IV, VI: Horizontal nystagmus on right lateral gaze V: Facial sensation is symmetric to light touch VII: Facial movement is symmetric.  VIII: hearing is intact to voice X: normal phonation XI: Shoulder shrug is symmetric. XII: tongue protrusion is midline.  Motor: Tone is normal. Bulk is normal. No focal weakness or drifts seen.  5/5 strength throughout.  Sensory: Sensation is symmetric to light touch in the arms and legs. Cerebellar: FNF and HKS are intact  bilaterally  Medications  Current Facility-Administered Medications:    acetaminophen (TYLENOL) tablet 650 mg, 650 mg, Oral, Q6H PRN **OR** acetaminophen (TYLENOL) suppository 650 mg, 650 mg, Rectal, Q6H PRN, Katrinka Blazing, Rondell A, MD   albuterol (PROVENTIL) (2.5 MG/3ML) 0.083% nebulizer solution 2.5 mg, 2.5 mg, Nebulization, Q6H PRN, Katrinka Blazing, Rondell A, MD   enoxaparin (LOVENOX) injection 60 mg, 60 mg, Subcutaneous, Q24H, Smith, Rondell A, MD   methylPREDNISolone sodium succinate (SOLU-MEDROL) 1,000 mg in sodium chloride 0.9 % 50 mL IVPB, 1,000 mg, Intravenous, Q24H, Lehner, Erin C, NP, Last Rate: 66 mL/hr at 12/09/23 1603, 1,000 mg at 12/09/23 1603   ondansetron (ZOFRAN) tablet 4 mg, 4 mg, Oral, Q6H PRN **OR** ondansetron (ZOFRAN) injection 4 mg, 4 mg, Intravenous, Q6H PRN, Smith, Rondell A, MD   pantoprazole (PROTONIX) injection 40 mg, 40 mg, Intravenous, Q24H, Lehner, Erin C, NP, 40 mg at 12/09/23 1559   sodium chloride flush (NS) 0.9 % injection 3 mL, 3 mL, Intravenous, Q12H, Smith, Rondell A, MD, 3 mL at 12/09/23 2128  Labs and Diagnostic Imaging   CBC:  Recent Labs  Lab 12/08/23 0647 12/09/23 0652 12/10/23 0355  WBC 7.7 6.4 9.3  NEUTROABS 4.5  --   --   HGB 14.3 13.9 14.5  HCT 44.4 41.8 43.9  MCV 89.0 87.3 86.4  PLT 328 278 326    Basic Metabolic Panel:  Lab Results  Component Value Date   NA  140 12/09/2023   K 4.6 12/09/2023   CO2 25 12/09/2023   GLUCOSE 104 (H) 12/09/2023   BUN 10 12/09/2023   CREATININE 0.78 12/09/2023   CALCIUM 9.2 12/09/2023   GFRNONAA >60 12/09/2023   Lipid Panel:  Lab Results  Component Value Date   LDLCALC 69 07/13/2023   HgbA1c:  Lab Results  Component Value Date   HGBA1C 5.9 07/13/2023   Urine Drug Screen: No results found for: "LABOPIA", "COCAINSCRNUR", "LABBENZ", "AMPHETMU", "THCU", "LABBARB"  Alcohol Level No results found for: "ETH" INR No results found for: "INR" APTT No results found for: "APTT" AED levels: No results found for:  "PHENYTOIN", "ZONISAMIDE", "LAMOTRIGINE", "LEVETIRACETA"  MR Cervical & Thoracic Spine (Personally reviewed): No spinal canal stenosis of the cervical or thoracic spine  Mild right C6-7 neural foraminal stenosis    MRI Brain(Personally reviewed): T2 hyperintensity and enhancement at the level of the bilateral facial collicului. 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   LP 4/3: PENDING Meningitis/Encephalitis Panel: Negative CSF Culture: Pending IgG CSF Index: Pending OG bands: Pending Protein:59 Glucose:25 Cell count: non-remarkable   Additional Labs: NMO: Pending Antiphospholipid Syndrome: Pending Quantiferon TB: Pending Sjogren's: Negative Complement, total: Negative C4 Complement: Negative C3 Complement: Negative Anti-DNA antibody: Negative AChR Antibodies: Negative ANA: Negative  CRP: Negative ESR: Negative RPR: Negative TSH: WNL   Hepatitis Panel: Negative HIV antibody: Negative  Assessment   Erika Mercer is a 53 y.o. female with PMH significant for anxiety/depression who presented 4/2 due to dizziness, vertigo and diplopia since 3/13. She describes these symptoms as constant, worsening with lying down and peripheral gaze, with diplopia being both horizontal and vertical. Patient denied rashes. Endorses camping last year and has multiple cats in her home.    Opthalmology was able to correct diplopia using prisms, which have been ordered by is office but not yet present. MRI completed 3/30, which revealed remote white matter insults favoring active demyelinating disease. Her neurologist, Dr. Allena Katz, recommended that she come in for CSF testing. MRI of C and T spine have not shown any evidence of active demyelinating disease.     LP under flouro was obtained to hone in on more specific differential diagnoses, as it is open to demyelinating processes (like MS) and infectious processes.  Patient is afebrile, no other evidence of infection. Multiple serum labs are still pending as well.  She was started on high-dose steroids for 3 days after LP was obtained    Recommendations  Continue to follow labs Continue Solu-Medrol 1 g daily for total of 5 days.  Today is day #2/ 5 Continue PPI while on steroids Monitor BG receiving high-dose steroids Will need outpatient follow-up with neurology in 2 to 4 weeks ______________________________________________________________________   Signed, Mathews Argyle, NP [facility]  I personally evaluated patient and agree with the assessment and plan with the following addition.  Continue IV MP 1 g daily for total of 5 days. NMO, OCB pending.   Anibal Henderson, MD [facility]

## 2023-12-11 DIAGNOSIS — H532 Diplopia: Secondary | ICD-10-CM | POA: Diagnosis not present

## 2023-12-11 LAB — CBC
HCT: 41.9 % (ref 36.0–46.0)
Hemoglobin: 14 g/dL (ref 12.0–15.0)
MCH: 29 pg (ref 26.0–34.0)
MCHC: 33.4 g/dL (ref 30.0–36.0)
MCV: 86.9 fL (ref 80.0–100.0)
Platelets: 336 10*3/uL (ref 150–400)
RBC: 4.82 MIL/uL (ref 3.87–5.11)
RDW: 14.4 % (ref 11.5–15.5)
WBC: 19.9 10*3/uL — ABNORMAL HIGH (ref 4.0–10.5)
nRBC: 0 % (ref 0.0–0.2)

## 2023-12-11 LAB — BASIC METABOLIC PANEL WITH GFR
Anion gap: 8 (ref 5–15)
BUN: 12 mg/dL (ref 6–20)
CO2: 26 mmol/L (ref 22–32)
Calcium: 9.6 mg/dL (ref 8.9–10.3)
Chloride: 109 mmol/L (ref 98–111)
Creatinine, Ser: 0.87 mg/dL (ref 0.44–1.00)
GFR, Estimated: >60 mL/min (ref >60)
Glucose, Bld: 156 mg/dL — ABNORMAL HIGH (ref 70–99)
Potassium: 4.1 mmol/L (ref 3.5–5.1)
Sodium: 143 mmol/L (ref 135–145)

## 2023-12-11 LAB — IGG CSF INDEX
Albumin CSF-mCnc: 12 mg/dL (ref 8–37)
Albumin: 4.3 g/dL (ref 3.8–4.9)
CSF IgG Index: 1.1 — ABNORMAL HIGH (ref 0.0–0.7)
IgG (Immunoglobin G), Serum: 960 mg/dL (ref 586–1602)
IgG, CSF: 3 mg/dL (ref 0.0–6.7)
IgG/Alb Ratio, CSF: 0.25 (ref 0.00–0.25)

## 2023-12-11 LAB — GLUCOSE, CAPILLARY
Glucose-Capillary: 150 mg/dL — ABNORMAL HIGH (ref 70–99)
Glucose-Capillary: 151 mg/dL — ABNORMAL HIGH (ref 70–99)

## 2023-12-11 MED ORDER — PANTOPRAZOLE SODIUM 40 MG IV SOLR
40.0000 mg | INTRAVENOUS | Status: DC
  Administered 2023-12-12: 40 mg via INTRAVENOUS
  Filled 2023-12-11: qty 10

## 2023-12-11 NOTE — Plan of Care (Signed)

## 2023-12-11 NOTE — Progress Notes (Signed)
 Occupational Therapy Treatment Patient Details Name: Erika Mercer MRN: [id number] DOB: 03-Apr-1971 Today's Date: 12/11/2023   History of present illness Pt is a 53 y.o. female admitted 12/08/23 after her neurologist advised her to go to ED for spinal tap d/t abnormal MRI on 3/30. C/o of diplopia, vertigo and nystagmus. Concern for possible demyelinating disease. S/p LP 4/3. PMH: anxiety, morbid obesity   OT comments  Pt. Seen for skilled OT treatment session.  Diplopia handout provided and reviewed with pt.  Pt. Able to return demo of all exercises provided yesterday with focus on depth perception.  Pt. Eager and motivated to continue to progress with improved vision and strategies.  Agree with OP OT recommendation for continued therapies upon d/c home.        If plan is discharge home, recommend the following:  Assistance with cooking/housework;Assist for transportation   Equipment Recommendations  None recommended by OT    Recommendations for Other Services      Precautions / Restrictions Precautions Precautions: Fall Precaution/Restrictions Comments: visual deficits       Mobility Bed Mobility                    Transfers                         Balance                                           ADL either performed or assessed with clinical judgement   ADL                                              Extremity/Trunk Assessment              Vision Baseline Vision/History: 1 Wears glasses Ability to See in Adequate Light: 0 Adequate Patient Visual Report: Diplopia;Nausea/blurring vision with head movement;Eye fatigue/eye pain/headache Eye Alignment: Impaired (comment) Ocular Range of Motion: Restricted on the left;Restricted on the right;Impaired-to be further tested in functional context Alignment/Gaze Preference: Within Defined Limits Tracking/Visual Pursuits: Decreased smoothness of horizontal  tracking;Decreased smoothness of vertical tracking Saccades: Additional head turns occurred during testing;Additional eye shifts occurred during testing;Decreased speed of saccadic movement Convergence: Impaired (comment) Visual Fields: No apparent deficits Diplopia Assessment: Disappears with one eye closed;Objects split side to side;Present all the time/all directions;Other (comment) Depth Perception: Undershoots;Overshoots Additional Comments: pt. reports glasses have taken away the double vision, reports when she does not have them on she is seeing double.  aware prism glasses will also correct but wanted more information on will they strengthen eyes or just take away the double vision.  reviewed prism glasses are not designed to necessarily strengthen. pt. states she will follow up with her eye md to see if she should cont. the glasses provided here also for cont. strengthening in conjunction with the prism glasses.  also discussed benefits of OP OT for further treatment.  pt. able to return demo of all depth perception exercises provided during yesterdays session.  reviewed when doing the "target" ones to switch up how she chooses the next target ie: left to right, then right to left in rows, pt. was going top to bottom and then across. noted to have stronger  accuracy on left column then towards the center slowed down and far right column took the longest amount of time with harder to achieve the target.  reviewed how these tasks can be incorporated into her adls ie: toothpaste on the brush, screwing caps on and off or putting caps on/off deoderant.  reviewed the diplopia handout and discussed about trials of thinning the tape as the eye gets stronger also.   Perception     Praxis     Communication Communication Communication: No apparent difficulties   Cognition Arousal: Alert Behavior During Therapy: WFL for tasks assessed/performed Cognition: No apparent impairments                                Following commands: Intact        Cueing      Exercises      Shoulder Instructions       General Comments      Pertinent Vitals/ Pain       Pain Assessment Pain Assessment: No/denies pain  Home Living                                          Prior Functioning/Environment              Frequency  Min 2X/week        Progress Toward Goals  OT Goals(current goals can now be found in the care plan section)  Progress towards OT goals: Progressing toward goals     Plan      Co-evaluation                 AM-PAC OT "6 Clicks" Daily Activity     Outcome Measure   Help from another person eating meals?: None Help from another person taking care of personal grooming?: None Help from another person toileting, which includes using toliet, bedpan, or urinal?: None Help from another person bathing (including washing, rinsing, drying)?: None Help from another person to put on and taking off regular upper body clothing?: None Help from another person to put on and taking off regular lower body clothing?: None 6 Click Score: 24    End of Session    OT Visit Diagnosis: Low vision, both eyes (H54.2);Dizziness and giddiness (R42)   Activity Tolerance Patient tolerated treatment well   Patient Left in bed;with call bell/phone within reach   Nurse Communication Other (comment) (rn states ok to work with pt.)        Time: 0910-0930 OT Time Calculation (min): 20 min  Charges: OT General Charges $OT Visit: 1 Visit OT Treatments $Therapeutic Exercise: 8-22 mins  Boneta Lucks, COTA/L Acute Rehabilitation [phone]   Alessandra Bevels Lorraine-COTA/L 12/11/2023, 9:52 AM

## 2023-12-11 NOTE — Progress Notes (Signed)
 [facility] Progress Note Patient: Erika Mercer ZOX:[id number] DOB: [birth year] DOA: 12/08/2023  DOS: the patient was seen and examined on 12/11/2023  Brief Hospital Course: PMH of anxiety, depression, morbid obesity present to the hospital with complaints of double vision.  Found to have an abnormal MRI concerning for multiple myeloma. Neurology consulted.  Repeat LP.  Now on IV Solu-Medrol for a total 5 days. Assessment and Plan: Concern for multiple sclerosis. Diplopia and vertigo. Presents with complaints of double vision as well as nystagmus ongoing since 3/13. MRI of the brain outpatient shows concern for possible demyelinating illness. Lumbar puncture performed, no pleocytosis.  Normal glucose normal protein. Further workup pending. Neurology consult. None neurology recommending IV Solu-Medrol for 5 days. Continue PPI.  Continue CBG twice daily.  PT recommended no therapy. Patient does not have multiple myeloma.   Obesity Class 3 There is no height or weight on file to calculate BMI.  Placing the pt at higher risk of poor outcomes.   Subjective: No nausea no vomiting no fever no chills.  Physical Exam: No thrush. Clear to auscultation. Persistent ophthalmoplegia on the left eye. Equal strength bilaterally. Bowel sound present.  Nontender.  Data Reviewed: I have Reviewed nursing notes, Vitals, and Lab results. Since last encounter, pertinent lab results CBC and BMP   .   Disposition: Status is: Inpatient Remains inpatient appropriate because: Receiving IV steroids.  Last day Monday   Family Communication: No one at bedside Level of care: Telemetry Medical   Vitals:   12/10/23 1947 12/11/23 0431 12/11/23 0735 12/11/23 1628  BP: (!) 132/55 119/66 123/67 129/60  Pulse: 75 71 67 65  Resp: 16 16 18 18   Temp: 98.1 F (36.7 C) 98 F (36.7 C) 98.5 F (36.9 C) 97.6 F (36.4 C)  TempSrc: Oral Oral Oral Oral  SpO2: 98% 97% 98% 99%     Author: Lynden Oxford,  MD 12/11/2023 5:53 PM  Please look on [email or web address] to find out who is on call.

## 2023-12-12 ENCOUNTER — Encounter: Payer: Self-pay | Admitting: Nurse Practitioner

## 2023-12-12 DIAGNOSIS — H532 Diplopia: Secondary | ICD-10-CM | POA: Diagnosis not present

## 2023-12-12 LAB — GLUCOSE, CAPILLARY
Glucose-Capillary: 141 mg/dL — ABNORMAL HIGH (ref 70–99)
Glucose-Capillary: 142 mg/dL — ABNORMAL HIGH (ref 70–99)

## 2023-12-12 LAB — CSF CULTURE W GRAM STAIN
Culture: NO GROWTH
Gram Stain: NONE SEEN

## 2023-12-12 NOTE — Progress Notes (Signed)
 Patient heart rate reached upto 38beats/min in telemonitor while asleep .Went and assessed the patient.Checked the pulse manually and was 60beats/min.

## 2023-12-12 NOTE — Plan of Care (Signed)

## 2023-12-12 NOTE — Progress Notes (Signed)
 NEUROLOGY CONSULT FOLLOW UP NOTE   Date of service: December 12, 2023 Patient Name: Erika Mercer MRN:  [id number] DOB:  07-03-1971  Interval Hx/subjective   Vertigo resolved. Still has diplopia. Visual acuity is intact.  Vitals   Vitals:   12/12/23 0605 12/12/23 0743 12/12/23 1730 12/12/23 1937  BP:  130/64 (!) 141/67 (!) 122/55  Pulse:  (!) 52 (!) 52 70  Resp:  18 18 18   Temp:  98.2 F (36.8 C) 98.2 F (36.8 C) 97.7 F (36.5 C)  TempSrc:  Oral Oral Oral  SpO2:  100% 100% 100%  Weight: 125.8 kg     Height: 5\' 6"  (1.676 m)        Body mass index is 44.76 kg/m.  Physical Exam   General: Laying comfortably in bed; in no acute distress.  HENT: Normal oropharynx and mucosa. Normal external appearance of ears and nose.  Neck: Supple, no pain or tenderness  CV: No JVD. No peripheral edema.  Pulmonary: Symmetric Chest rise. Normal respiratory effort.  Abdomen: Soft to touch, non-tender.  Ext: No cyanosis, edema, or deformity  Skin: No rash. Normal palpation of skin.   Musculoskeletal: Normal digits and nails by inspection. No clubbing.   Neurologic Examination  Mental status/Cognition: Alert, oriented to self, place, month and year, good attention.  Speech/language: Fluent, comprehension intact, object naming intact, repetition intact.  Cranial nerves:   CN II Pupils equal and reactive to light, no VF deficits    CN III,IV,VI EOM intact, no gaze preference or deviation, no nystagmus. Dysconjugate gaze noted to right horizontal gaze with slight right abducens palsy?   CN V normal sensation in V1, V2, and V3 segments bilaterally    CN VII no asymmetry, no nasolabial fold flattening    CN VIII normal hearing to speech    CN IX & X normal palatal elevation, no uvular deviation    CN XI 5/5 head turn and 5/5 shoulder shrug bilaterally    CN XII midline tongue protrusion    Motor:  Muscle bulk: normal, tone normal, pronator drift none tremor none Mvmt Root Nerve  Muscle Right  Left Comments  SA C5/6 Ax Deltoid 5 5   EF C5/6 Mc Biceps 5 5   EE C6/7/8 Rad Triceps 5 5   WF C6/7 Med FCR     WE C7/8 PIN ECU     F Ab C8/T1 U ADM/FDI 5 5   HF L1/2/3 Fem Illopsoas 5 5   KE L2/3/4 Fem Quad 5 5   DF L4/5 D Peron Tib Ant 5 5   PF S1/2 Tibial Grc/Sol 5 5    Sensation:  Light touch Intact throughout   Pin prick    Temperature    Vibration   Proprioception    Coordination/Complex Motor:  - Finger to Nose intact BL - Heel to shin intact BL - Rapid alternating movement are normal - Gait: deferred.  Medications  Current Facility-Administered Medications:    acetaminophen (TYLENOL) tablet 650 mg, 650 mg, Oral, Q6H PRN **OR** acetaminophen (TYLENOL) suppository 650 mg, 650 mg, Rectal, Q6H PRN, Smith, Rondell A, MD   albuterol (PROVENTIL) (2.5 MG/3ML) 0.083% nebulizer solution 2.5 mg, 2.5 mg, Nebulization, Q6H PRN, Smith, Rondell A, MD   enoxaparin (LOVENOX) injection 60 mg, 60 mg, Subcutaneous, Q24H, Smith, Rondell A, MD   methylPREDNISolone sodium succinate (SOLU-MEDROL) 1,000 mg in sodium chloride 0.9 % 50 mL IVPB, 1,000 mg, Intravenous, Q24H, Dessie, Habtemariam, MD, Last Rate: 66 mL/hr at 12/12/23 1103,  1,000 mg at 12/12/23 1103   ondansetron (ZOFRAN) tablet 4 mg, 4 mg, Oral, Q6H PRN **OR** ondansetron (ZOFRAN) injection 4 mg, 4 mg, Intravenous, Q6H PRN, Smith, Rondell A, MD   sodium chloride flush (NS) 0.9 % injection 3 mL, 3 mL, Intravenous, Q12H, Smith, Rondell A, MD, 3 mL at 12/12/23 1108  Labs and Diagnostic Imaging   CBC:  Recent Labs  Lab 12/08/23 0647 12/09/23 0652 12/10/23 0355 12/11/23 0605  WBC 7.7   < > 9.3 19.9*  NEUTROABS 4.5  --   --   --   HGB 14.3   < > 14.5 14.0  HCT 44.4   < > 43.9 41.9  MCV 89.0   < > 86.4 86.9  PLT 328   < > 326 336   < > = values in this interval not displayed.    Basic Metabolic Panel:  Lab Results  Component Value Date   NA 143 12/11/2023   K 4.1 12/11/2023   CO2 26 12/11/2023   GLUCOSE 156 (H) 12/11/2023    BUN 12 12/11/2023   CREATININE 0.87 12/11/2023   CALCIUM 9.6 12/11/2023   GFRNONAA >60 12/11/2023   Lipid Panel:  Lab Results  Component Value Date   LDLCALC 69 07/13/2023   HgbA1c:  Lab Results  Component Value Date   HGBA1C 5.9 07/13/2023   Urine Drug Screen: No results found for: "LABOPIA", "COCAINSCRNUR", "LABBENZ", "AMPHETMU", "THCU", "LABBARB"  Alcohol Level No results found for: "ETH" INR No results found for: "INR" APTT No results found for: "APTT" AED levels: No results found for: "PHENYTOIN", "ZONISAMIDE", "LAMOTRIGINE", "LEVETIRACETA"  MRI Brain(Personally reviewed): T2 hyperintensity and enhancement at the level of the bilateral facial collicului. 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 Pending on clinical workup, have low threshold for imaging follow-up given the somewhat unusual pattern.   MRC and T spine(Personally reviewed): 1. No spinal canal stenosis of the cervical or thoracic spine. 2. Mild right C6-7 neural foraminal stenosis.  LP 4/3: Meningitis/Encephalitis Panel: Negative CSF Culture: no growth so far IgG CSF Index: mildly elevated. OG bands: Pending Protein:59 Glucose:25 Cell count: non-remarkable   Additional Labs: NMO: negative. Antiphospholipid Syndrome: Pending Quantiferon TB: Pending Sjogren's: Negative Complement, total: Negative C4 Complement: Negative C3 Complement: Negative Anti-DNA antibody: Negative AChR Antibodies: Negative ANA: Negative  CRP: Negative ESR: Negative RPR: Negative TSH: WNL   Hepatitis Panel: Negative HIV antibody: Negative     Assessment   Erika Mercer is a 53 y.o. female with PMH significant for anxiety/depression who presented 4/2 due to dizziness, vertigo and diplopia since 3/13. She describes these symptoms as constant, worsening with lying down and peripheral gaze, with diplopia being both horizontal and vertical.  Patient denied rashes. Endorses camping last year and has multiple cats in her home.    Opthalmology was able to correct diplopia using prisms, which have been ordered by is office but not yet present. MRI completed 3/30, which revealed remote white matter insults favoring active demyelinating disease. Her neurologist, Dr. Allena Katz, recommended that she come in for CSF testing. MRI of C and T spine have not shown any evidence of active demyelinating disease.     LP under flouro was obtained to hone in on more specific differential diagnoses, as it is open to demyelinating processes (like MS) and infectious processes. Patient is afebrile, no other evidence of infection. Multiple serum labs are still pending as well.  She was started  on high-dose steroids for 3 days after LP was obtained  Recommendations  Continue to follow labs Continue Solu-Medrol 1 g daily for total of 5 days.  Today is day #4/ 5 Continue PPI while on steroids Monitor BG receiving high-dose steroids Will need outpatient follow-up with neurology in 2 to 4 weeks ______________________________________________________________________   Signed, Erick Blinks, MD [facility]

## 2023-12-12 NOTE — Progress Notes (Signed)
 [facility] PROGRESS NOTE  Patient: Erika Mercer WUJ:[id number]   PCP: Gerre Scull, NP DOB: 1971/02/21   DOA: 12/08/2023   DOS: 12/12/2023    Subjective: No acute complaint.  No nausea no vomiting no fever no chills.  Objective:  Vitals:   12/12/23 0445 12/12/23 0605 12/12/23 0743 12/12/23 1730  BP: (!) 149/61  130/64 (!) 141/67  Pulse: 62  (!) 52 (!) 52  Resp: 18  18 18   Temp: 98 F (36.7 C)  98.2 F (36.8 C) 98.2 F (36.8 C)  TempSrc: Oral  Oral Oral  SpO2: 100%  100% 100%  Weight:  125.8 kg    Height:  5\' 6"  (1.676 m)     Improvement in nystagmus.  Ophthalmoplegia present on left eye. No thrush. Telemetry monitor shows sinus bradycardia.  Assessment and plan: Concern for demyelinating disease. Continue with IV Solu-Medrol.  Last day tomorrow on 4/7. I called the pharmacy to retime the infusion to 8:00 in the morning if possible  Sinus bradycardia. Occurred at nighttime during sleep. Suspect possible sleep apnea. Outpatient workup recommended.  Author: Lynden Oxford, MD [facility] 12/12/2023 6:18 PM   If 7PM-7AM, please contact night-coverage at [email or web address]

## 2023-12-13 DIAGNOSIS — H532 Diplopia: Secondary | ICD-10-CM | POA: Diagnosis not present

## 2023-12-13 LAB — QUANTIFERON-TB GOLD PLUS (RQFGPL)
QuantiFERON Mitogen Value: >10 [IU]/mL
QuantiFERON Nil Value: 0.05 [IU]/mL
QuantiFERON TB1 Ag Value: 0.09 [IU]/mL
QuantiFERON TB2 Ag Value: 0.06 [IU]/mL

## 2023-12-13 LAB — MISC LABCORP TEST (SEND OUT): Labcorp test code: 160457

## 2023-12-13 LAB — QUANTIFERON-TB GOLD PLUS: QuantiFERON-TB Gold Plus: NEGATIVE

## 2023-12-13 MED ORDER — SODIUM CHLORIDE 0.9 % IV SOLN
1000.0000 mg | INTRAVENOUS | Status: AC
  Administered 2023-12-13: 1000 mg via INTRAVENOUS
  Filled 2023-12-13: qty 16

## 2023-12-13 NOTE — Progress Notes (Addendum)
 Occupational Therapy Treatment Patient Details Name: Erika Mercer MRN: [id number] DOB: [birth year] Today's Date: 12/13/2023   History of present illness Pt is a 53 y.o. female admitted 12/08/23 after her neurologist advised her to go to ED for spinal tap d/t abnormal MRI on 3/30. C/o of diplopia, vertigo and nystagmus. Concern for possible demyelinating disease. S/p LP 4/3. PMH: anxiety, morbid obesity   OT comments  Pt progressing well towards goals. HEP reviewed with pt. Pt able to return demo of all exercises. Discuessed use of prism glasses from her optometrist to decrease diplopia. Pt reports improved nystagmus and diplopia with use of partial occlusion glasses. OT educated pt on benefits of outpatient OT to further address vision and facilitate a safe return to work. No further acute OT needs identified, OT is signing off on this pt.       If plan is discharge home, recommend the following:  Assistance with cooking/housework;Assist for transportation   Equipment Recommendations  None recommended by OT    Recommendations for Other Services      Precautions / Restrictions Precautions Precautions: Fall Precaution/Restrictions Comments: visual deficits Restrictions Weight Bearing Restrictions Per Provider Order: No       Mobility Bed Mobility Overal bed mobility: Independent      Transfers Overall transfer level: Independent    Balance Overall balance assessment: Modified Independent     ADL either performed or assessed with clinical judgement   ADL Overall ADL's : At baseline     General ADL Comments: not currently driving; filing FMLA    Extremity/Trunk Assessment Upper Extremity Assessment Upper Extremity Assessment: Overall WFL for tasks assessed   Lower Extremity Assessment Lower Extremity Assessment: Defer to PT evaluation        Vision   Vision Assessment?: Yes Eye Alignment: Impaired (comment) Ocular Range of Motion: Restricted on the left;Restricted  on the right;Impaired-to be further tested in functional context Alignment/Gaze Preference: Within Defined Limits Tracking/Visual Pursuits: Decreased smoothness of horizontal tracking;Decreased smoothness of vertical tracking Saccades: Additional head turns occurred during testing;Additional eye shifts occurred during testing;Decreased speed of saccadic movement Convergence: Impaired (comment) Visual Fields: No apparent deficits Diplopia Assessment: Disappears with one eye closed;Objects split side to side;Present all the time/all directions;Other (comment) Depth Perception: Undershoots;Overshoots Additional Comments: Pt reports improved nystagmus and improved double vision with the use of partial occulsion glasses   Perception Perception Perception: Within Functional Limits      Communication Communication Communication: No apparent difficulties   Cognition Arousal: Alert Behavior During Therapy: WFL for tasks assessed/performed Cognition: No apparent impairments     Following commands: Intact           Exercises Other Exercises Other Exercises: activities to work on depth perception/eye-hand coordiantion: hitting target  on paper wiht pen; placing pen through tape roll without hitting tape roll edge; placing cap on pen Other Exercises: visual tracking wiht head turns/VOR            Pertinent Vitals/ Pain       Pain Assessment Pain Assessment: No/denies pain      Progress Toward Goals  OT Goals(current goals can now be found in the care plan section)  Progress towards OT goals: Goals met/education completed, patient discharged from OT  Acute Rehab OT Goals Patient Stated Goal: To return to work OT Goal Formulation: With patient Time For Goal Achievement: 12/24/23 Potential to Achieve Goals: Good ADL Goals Pt/caregiver will Perform Home Exercise Program: Independently;With written HEP provided Additional ADL Goal #1: Pt will verbalize  understanding use and  progression of partial occlusion to improve functional vision Additional ADL Goal #2: Pt will independently verbalize 3 strategies to reduce risk of falls  Plan         AM-PAC OT "6 Clicks" Daily Activity     Outcome Measure   Help from another person eating meals?: None Help from another person taking care of personal grooming?: None Help from another person toileting, which includes using toliet, bedpan, or urinal?: None Help from another person bathing (including washing, rinsing, drying)?: None Help from another person to put on and taking off regular upper body clothing?: None Help from another person to put on and taking off regular lower body clothing?: None 6 Click Score: 24    End of Session    OT Visit Diagnosis: Low vision, both eyes (H54.2);Dizziness and giddiness (R42)   Activity Tolerance Patient tolerated treatment well   Patient Left in bed;with call bell/phone within reach   Nurse Communication Mobility status        Time: 7829-5621 OT Time Calculation (min): 11 min  Charges: OT General Charges $OT Visit: 1 Visit OT Treatments $Therapeutic Exercise: 8-22 mins  Ivor Messier, OT  Acute Rehabilitation Services Office [phone] Secure chat preferred   Marilynne Drivers 12/13/2023, 10:58 AM

## 2023-12-13 NOTE — Discharge Summary (Signed)
 Physician Discharge Summary   Patient: Erika Mercer MRN: [id number] DOB: 1971/06/10  Admit date:     12/08/2023  Discharge date: 12/13/2023  Discharge Physician: Lynden Oxford  PCP: Gerre Scull, NP  Recommendations at discharge: Follow-up with PCP in 1 week. Follow-up with Dr. Epimenio Foot as recommended with neurology.   Follow-up Information     Gerre Scull, NP. Schedule an appointment as soon as possible for a visit in 2 week(s).   Specialty: Internal Medicine Why: may need sleep apnea study Contact information: [address] [place] [address] [phone]         Asa Lente, MD. Schedule an appointment as soon as possible for a visit in 2 week(s).   Specialty: Neurology Contact information: [address] [place] [address] [phone]                 Discharge Diagnoses: Principal Problem:   Diplopia Active Problems:   Vertigo   Demyelinating disease (HCC)   Morbid obesity with BMI of 40.0-44.9, adult (HCC)   MS (multiple sclerosis) [facility])    Hospital Course: PMH of anxiety, depression, morbid obesity present to the hospital with complaints of double vision.  Found to have an abnormal MRI concerning for multiple myeloma. Neurology consulted.  Repeat LP.  Treated with IV Solu-Medrol for a total 5 days.  Assessment and Plan: Concern for multiple sclerosis. Diplopia and vertigo. Presents with complaints of double vision as well as nystagmus ongoing since 3/13. MRI of the brain outpatient shows concern for possible demyelinating illness. Lumbar puncture performed, no pleocytosis.  Normal glucose normal protein. Further workup pending. Neurology consult. Neurology recommended IV Solu-Medrol for 5 days. PT recommended no therapy. Patient does not have multiple myeloma.  Outpatient follow-up with Dr. Kaylyn Lim recommended.   Obesity Class 3 There is no height or weight on file to calculate BMI.  Placing the pt at higher risk of  poor outcomes. Recommending outpatient follow-up with PCP or neurology for sleep apnea evaluation.  Sinus bradycardia. Seen incidentally on telemetry while she was sleeping. Raising concerns for possible sleep apnea. Outpatient follow-up recommended.  Consultants:  Neurology  Procedures performed:  LP  DISCHARGE MEDICATION: Allergies as of 12/13/2023       Reactions   Promethazine Anxiety   Restless leg        Medication List    You have not been prescribed any medications.    Disposition: Home Diet recommendation: Cardiac diet  Discharge Exam: Vitals:   12/12/23 1730 12/12/23 1937 12/13/23 0342 12/13/23 0733  BP: (!) 141/67 (!) 122/55 135/63 (!) 153/69  Pulse: (!) 52 70 (!) 57 (!) 45  Resp: 18 18 17 16   Temp: 98.2 F (36.8 C) 97.7 F (36.5 C) 98.2 F (36.8 C) 98 F (36.7 C)  TempSrc: Oral Oral Oral Oral  SpO2: 100% 100% 97% 99%  Weight:      Height:       General: Appear in mild distress; no visible Abnormal Neck Mass Or lumps, Conjunctiva normal Cardiovascular: S1 and S2 Present, no Murmur, Respiratory: good respiratory effort, Bilateral Air entry present and CTA, no Crackles, no wheezes Abdomen: Bowel Sound present, Non tender  Extremities: no Pedal edema Neurology: alert and oriented to time, place, and person ophthalmoplegia of the left eye, right eye nystagmus on right lateral movement. Filed Weights   12/12/23 0605  Weight: 125.8 kg   Condition at discharge: stable  The results of significant diagnostics from this hospitalization (including imaging, microbiology,  ancillary and laboratory) are listed below for reference.   Imaging Studies: DG FL GUIDED LUMBAR PUNCTURE Result Date: 12/09/2023 CLINICAL DATA:  53 year old female with a 3 week history of dizziness and diplopia. Outpatient MRI concerning for demyelinating disease. Request for lumbar puncture. EXAM: LUMBAR PUNCTURE UNDER FLUOROSCOPY PROCEDURE: An appropriate skin entry site was determined  fluoroscopically. Operator donned sterile gloves and mask. Skin site was marked, then prepped with Betadine, draped in usual sterile fashion, and infiltrated locally with 1% lidocaine. A 20 gauge spinal needle was advanced into the thecal sac at L2-3 from a left interlaminar approach. Clear colorless CSF spontaneously returned, with opening pressure of 19.5 cm water. 12 ml CSF were collected and divided among 4 sterile vials for the requested laboratory studies. The needle was then removed. The patient tolerated the procedure well and there were no complications. FLUOROSCOPY: Radiation Exposure Index (as provided by the fluoroscopic device): 6.8 mGy Kerma IMPRESSION: Technically successful lumbar puncture under fluoroscopy. This exam was performed by [facility] PA-C, and was supervised and interpreted by Dr. Corlis Leak. Electronically Signed   By: Corlis Leak M.D.   On: 12/09/2023 19:43   MR CERVICAL SPINE W WO CONTRAST Result Date: 12/08/2023 CLINICAL DATA:  CSF leak.  Intracranial hypotension EXAM: MRI CERVICAL AND THORACIC SPINE WITHOUT AND WITH CONTRAST TECHNIQUE: Multiplanar and multiecho pulse sequences of the cervical spine, to include the craniocervical junction and cervicothoracic junction, and the thoracic spine, were obtained without and with intravenous contrast. CONTRAST:  10mL GADAVIST GADOBUTROL 1 MMOL/ML IV SOLN COMPARISON:  None Available. FINDINGS: MRI CERVICAL SPINE FINDINGS Alignment: Physiologic. Vertebrae: No fracture, evidence of discitis, or bone lesion. Cord: Normal signal and morphology. No abnormal contrast enhancement. Posterior Fossa, vertebral arteries, paraspinal tissues: Negative. Disc levels: C1-2: Unremarkable. C2-3: Normal disc space and facet joints. There is no spinal canal stenosis. No neural foraminal stenosis. C3-4: Normal disc space and facet joints. There is no spinal canal stenosis. No neural foraminal stenosis. C4-5: Normal disc space and facet joints. There is no  spinal canal stenosis. No neural foraminal stenosis. C5-6: Small disc bulge. There is no spinal canal stenosis. No neural foraminal stenosis. C6-7: Right foraminal disc protrusion with uncovertebral hypertrophy. There is no spinal canal stenosis. Mild right neural foraminal stenosis. C7-T1: Normal disc space and facet joints. There is no spinal canal stenosis. No neural foraminal stenosis. MRI THORACIC SPINE FINDINGS Alignment:  Physiologic. Vertebrae: No fracture, evidence of discitis, or bone lesion. Cord: Normal signal and morphology. No abnormal contrast enhancement. Paraspinal and other soft tissues: Negative. Disc levels: No spinal canal or neural foraminal stenosis IMPRESSION: 1. No spinal canal stenosis of the cervical or thoracic spine. 2. Mild right C6-7 neural foraminal stenosis. Electronically Signed   By: Deatra Robinson M.D.   On: 12/08/2023 23:33   MR THORACIC SPINE W WO CONTRAST Result Date: 12/08/2023 CLINICAL DATA:  CSF leak.  Intracranial hypotension EXAM: MRI CERVICAL AND THORACIC SPINE WITHOUT AND WITH CONTRAST TECHNIQUE: Multiplanar and multiecho pulse sequences of the cervical spine, to include the craniocervical junction and cervicothoracic junction, and the thoracic spine, were obtained without and with intravenous contrast. CONTRAST:  10mL GADAVIST GADOBUTROL 1 MMOL/ML IV SOLN COMPARISON:  None Available. FINDINGS: MRI CERVICAL SPINE FINDINGS Alignment: Physiologic. Vertebrae: No fracture, evidence of discitis, or bone lesion. Cord: Normal signal and morphology. No abnormal contrast enhancement. Posterior Fossa, vertebral arteries, paraspinal tissues: Negative. Disc levels: C1-2: Unremarkable. C2-3: Normal disc space and facet joints. There is no spinal canal stenosis. No  neural foraminal stenosis. C3-4: Normal disc space and facet joints. There is no spinal canal stenosis. No neural foraminal stenosis. C4-5: Normal disc space and facet joints. There is no spinal canal stenosis. No neural  foraminal stenosis. C5-6: Small disc bulge. There is no spinal canal stenosis. No neural foraminal stenosis. C6-7: Right foraminal disc protrusion with uncovertebral hypertrophy. There is no spinal canal stenosis. Mild right neural foraminal stenosis. C7-T1: Normal disc space and facet joints. There is no spinal canal stenosis. No neural foraminal stenosis. MRI THORACIC SPINE FINDINGS Alignment:  Physiologic. Vertebrae: No fracture, evidence of discitis, or bone lesion. Cord: Normal signal and morphology. No abnormal contrast enhancement. Paraspinal and other soft tissues: Negative. Disc levels: No spinal canal or neural foraminal stenosis IMPRESSION: 1. No spinal canal stenosis of the cervical or thoracic spine. 2. Mild right C6-7 neural foraminal stenosis. Electronically Signed   By: Deatra Robinson M.D.   On: 12/08/2023 23:33   MR Brain W Wo Contrast Result Date: 12/05/2023 CLINICAL DATA:  Double vision and nystagmus EXAM: MRI HEAD WITHOUT AND WITH CONTRAST TECHNIQUE: Multiplanar, multiecho pulse sequences of the brain and surrounding structures were obtained without and with intravenous contrast. CONTRAST:  10mL GADAVIST GADOBUTROL 1 MMOL/ML IV SOLN COMPARISON:  None Available. FINDINGS: Brain: T2/FLAIR/diffusion hyperintensity with enhancement at the bilateral facial colliculus level. There are nonenhancing areas of ovoid FLAIR hyperintensity in the bilateral cerebral white matter numbering 4 or 5. No juxtacortical lesion is seen. No brain atrophy, typical infarct, hemorrhage, hydrocephalus, or swelling. Vascular: Normal flow voids. Skull and upper cervical spine: Normal marrow signal. Sinuses/Orbits: Negative. IMPRESSION: T2 hyperintensity and enhancement at the level of the bilateral facial collicului. 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 Pending on clinical workup, have low threshold for imaging follow-up given the  somewhat unusual pattern. Electronically Signed   By: Tiburcio Pea M.D.   On: 12/05/2023 12:54    Microbiology: Results for orders placed or performed during the hospital encounter of 12/08/23  CSF culture w Gram Stain     Status: None   Collection Time: 12/08/23  7:57 AM   Specimen: CSF; Cerebrospinal Fluid  Result Value Ref Range Status   Specimen Description CSF  Final   Special Requests NONE  Final   Gram Stain NO WBC SEEN NO ORGANISMS SEEN CYTOSPIN SMEAR   Final   Culture   Final    NO GROWTH 3 DAYS Performed at [facility], [address], [place], [place] [address]    Report Status 12/12/2023 FINAL  Final   Labs: CBC: Recent Labs  Lab 12/08/23 0647 12/09/23 0652 12/10/23 0355 12/11/23 0605  WBC 7.7 6.4 9.3 19.9*  NEUTROABS 4.5  --   --   --   HGB 14.3 13.9 14.5 14.0  HCT 44.4 41.8 43.9 41.9  MCV 89.0 87.3 86.4 86.9  PLT 328 278 326 336   Basic Metabolic Panel: Recent Labs  Lab 12/08/23 0647 12/09/23 0652 12/11/23 0605  NA 139 140 143  K 3.7 4.6 4.1  CL 108 106 109  CO2 22 25 26   GLUCOSE 101* 104* 156*  BUN 11 10 12   CREATININE 0.79 0.78 0.87  CALCIUM 8.8* 9.2 9.6  MG 2.1  --   --    Liver Function Tests: Recent Labs  Lab 12/08/23 0647 12/09/23 0950  AST 22  --   ALT 21  --   ALKPHOS 38  --  BILITOT 0.7  --   PROT 6.5  --   ALBUMIN 3.8 4.3   CBG: Recent Labs  Lab 12/11/23 0730 12/11/23 1837 12/12/23 0718 12/12/23 1729  GLUCAP 150* 151* 141* 142*    Discharge time spent: greater than 30 minutes.  Author: Lynden Oxford, MD  [facility] 12/13/2023

## 2023-12-13 NOTE — Progress Notes (Signed)
 AVS printed and placed with patient charge; waiting for IV meds to finish.

## 2023-12-13 NOTE — Progress Notes (Signed)
 NEUROLOGY CONSULT FOLLOW UP NOTE   Date of service: December 13, 2023 Patient Name: Erika Mercer MRN:  [id number] DOB:  1971-05-31  Interval Hx/subjective   Patient ambulating in room. No family at bedside.  She reports subjective improvement in diplopia. Visual acuity intact. Denies headache, n/v, SOB. She only has dizziness when she turns her head quickly to one side.   Patient is pleased with improvement seen with steroid treatment, is ready to go home. Follow-up care discussed thoroughly at bedside, patient in agreement.  Vitals   Vitals:   12/12/23 0743 12/12/23 1730 12/12/23 1937 12/13/23 0342  BP: 130/64 (!) 141/67 (!) 122/55 135/63  Pulse: (!) 52 (!) 52 70 (!) 57  Resp: 18 18 18 17   Temp: 98.2 F (36.8 C) 98.2 F (36.8 C) 97.7 F (36.5 C) 98.2 F (36.8 C)  TempSrc: Oral Oral Oral Oral  SpO2: 100% 100% 100% 97%  Weight:      Height:         Body mass index is 44.76 kg/m.  Physical Exam   General: Laying comfortably in bed; in no acute distress.  HENT: Normal oropharynx and mucosa. Normal external appearance of ears and nose.  Neck: Supple, no pain or tenderness  CV: No JVD. No peripheral edema.  Pulmonary: Symmetric Chest rise. Normal respiratory effort.  Abdomen: Soft to touch, non-tender.  Ext: No cyanosis, edema, or deformity  Skin: No rash. Normal palpation of skin.   Musculoskeletal: Normal digits and nails by inspection. No clubbing.   Neurologic Examination  Mental status/Cognition: Alert, oriented to self, place, month and year, good attention.  Speech/language: Fluent, comprehension intact, object naming intact, repetition intact.  Cranial nerves:   CN II Pupils equal and reactive to light, no VF deficits. Binocular diplopia   CN III,IV,VI EOM intact, no gaze preference or deviation, no nystagmus. Right horizontal gaze reduced with ?abducens palsy   CN V normal sensation in V1, V2, and V3 segments bilaterally    CN VII Facial movement symmetric   CN  VIII Hearing intact to voice   CN IX & X No hoarseness   CN XI 5/5 head turn and 5/5 shoulder shrug bilaterally    CN XII midline tongue protrusion    Motor:  Muscle bulk: normal, tone normal, pronator drift none tremor none 5/5 in all extremities with spontaneous and to command movement.   Sensation: Intact and symmetric to light touch in all extremities  Coordination/Complex Motor:  - Finger to Nose intact BL - Heel to shin intact BL - Fine motor movements intact and symmetric - Gait: deferred.  Medications  Current Facility-Administered Medications:    acetaminophen (TYLENOL) tablet 650 mg, 650 mg, Oral, Q6H PRN **OR** acetaminophen (TYLENOL) suppository 650 mg, 650 mg, Rectal, Q6H PRN, Katrinka Blazing, Rondell A, MD   albuterol (PROVENTIL) (2.5 MG/3ML) 0.083% nebulizer solution 2.5 mg, 2.5 mg, Nebulization, Q6H PRN, Katrinka Blazing, Rondell A, MD   enoxaparin (LOVENOX) injection 60 mg, 60 mg, Subcutaneous, Q24H, Smith, Rondell A, MD   methylPREDNISolone sodium succinate (SOLU-MEDROL) 1,000 mg in sodium chloride 0.9 % 50 mL IVPB, 1,000 mg, Intravenous, Q24H, Dessie, Habtemariam, MD, Last Rate: 66 mL/hr at 12/12/23 1103, 1,000 mg at 12/12/23 1103   ondansetron (ZOFRAN) tablet 4 mg, 4 mg, Oral, Q6H PRN **OR** ondansetron (ZOFRAN) injection 4 mg, 4 mg, Intravenous, Q6H PRN, Smith, Rondell A, MD   sodium chloride flush (NS) 0.9 % injection 3 mL, 3 mL, Intravenous, Q12H, Smith, Rondell A, MD, 3 mL at 12/12/23 2315  Labs and Diagnostic Imaging   CBC:  Recent Labs  Lab 12/08/23 0647 12/09/23 0652 12/10/23 0355 12/11/23 0605  WBC 7.7   < > 9.3 19.9*  NEUTROABS 4.5  --   --   --   HGB 14.3   < > 14.5 14.0  HCT 44.4   < > 43.9 41.9  MCV 89.0   < > 86.4 86.9  PLT 328   < > 326 336   < > = values in this interval not displayed.    Basic Metabolic Panel:  Lab Results  Component Value Date   NA 143 12/11/2023   K 4.1 12/11/2023   CO2 26 12/11/2023   GLUCOSE 156 (H) 12/11/2023   BUN 12  12/11/2023   CREATININE 0.87 12/11/2023   CALCIUM 9.6 12/11/2023   GFRNONAA >60 12/11/2023   Lipid Panel:  Lab Results  Component Value Date   LDLCALC 69 07/13/2023   HgbA1c:  Lab Results  Component Value Date   HGBA1C 5.9 07/13/2023   Urine Drug Screen: No results found for: "LABOPIA", "COCAINSCRNUR", "LABBENZ", "AMPHETMU", "THCU", "LABBARB"  Alcohol Level No results found for: "ETH" INR No results found for: "INR" APTT No results found for: "APTT" AED levels: No results found for: "PHENYTOIN", "ZONISAMIDE", "LAMOTRIGINE", "LEVETIRACETA"  MRI Brain(Personally reviewed): T2 hyperintensity and enhancement at the level of the bilateral facial collicului. 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 Pending on clinical workup, have low threshold for imaging follow-up given the somewhat unusual pattern.   MRC and T spine(Personally reviewed): 1. No spinal canal stenosis of the cervical or thoracic spine. 2. Mild right C6-7 neural foraminal stenosis.  LP 4/3: Meningitis/Encephalitis Panel: Negative CSF Culture: no growth IgG CSF Index: mildly elevated. OG bands: Pending Protein:59 Glucose:25 Cell count: non-remarkable   Additional Labs: NMO: Negative Antiphospholipid Syndrome: Pending Quantiferon TB: Negative Sjogren's: Negative Complement, total: Negative C4 Complement: Negative C3 Complement: Negative Anti-DNA antibody: Negative AChR Antibodies: Negative ANA: Negative  CRP: Negative ESR: Negative RPR: Negative TSH: WNL   Hepatitis Panel: Negative HIV antibody: Negative     Assessment   Erika Mercer is a 53 y.o. female with PMH significant for anxiety/depression who presented 4/2 due to dizziness, vertigo and diplopia since 3/13. She describes these symptoms as constant, worsening with lying down and peripheral gaze, with diplopia being both horizontal and vertical. Patient denied  rashes. Endorses camping last year and has multiple cats in her home.    Opthalmology was able to correct diplopia using prisms, which have been ordered by is office but not yet present. MRI completed 3/30, which revealed remote white matter insults favoring active demyelinating disease. Her neurologist, Dr. Allena Katz, recommended that she come in for CSF testing. MRI of C and T spine have not shown any evidence of active demyelinating disease.     LP under flouro was obtained. Patient is afebrile, no other evidence of infection. Multiple serum labs are still pending as well.  She was started on high-dose steroids for 3 days after LP was obtained, these complete this morning.   Exam consistent with improvement in diplopia, as she subjectively reports that the double objects she is seeing are becoming closer together. Binocular Diplopia is relieved with closing one eye or using eye patch.   Recommendations   Patient is OK for discharge from neurology standpoint, with recommendations as above. Follow-up with Dr. Epimenio Foot, in 2-4 weeks. Ambulatory referral placed.   -  Discussed that she should not drive until after seeing neurology outpatient and diplopia has completely resolved.   ______________________________________________________________________   Lynnae January, DNP, AGACNP-BC [facility] Please use AMION for contact information & EPIC for messaging.

## 2023-12-13 NOTE — Plan of Care (Signed)

## 2023-12-13 NOTE — Progress Notes (Signed)
 Patient has been discharged per MD order. IV removed and tolerated well. No complaints of pain at this time. Discharge instructions reviewed with patient and verbalized understanding. Patient has been transferred by wheelchair to be picked up at valet parking.

## 2023-12-14 ENCOUNTER — Telehealth: Payer: Self-pay

## 2023-12-14 LAB — ANTIPHOSPHOLIPID SYNDROME EVAL, BLD
Anticardiolipin IgA: <9 U/mL (ref 0–11)
Anticardiolipin IgG: <9 GPL U/mL (ref 0–14)
Anticardiolipin IgM: 12 [MPL'U]/mL (ref 0–12)
DRVVT: 33.5 s (ref 0.0–47.0)
PTT Lupus Anticoagulant: 32.8 s (ref 0.0–43.5)
Phosphatydalserine, IgA: <1 {APS'U} (ref 0–19)
Phosphatydalserine, IgG: <9 U (ref 0–30)
Phosphatydalserine, IgM: 10 U (ref 0–30)

## 2023-12-14 NOTE — Transitions of Care (Post Inpatient/ED Visit) (Signed)
   12/14/2023  Name: Erika Mercer MRN: [id number] DOB: October 14, 1970  Today's TOC FU Call Status: Today's TOC FU Call Status:: Successful TOC FU Call Completed TOC FU Call Complete Date: 12/14/23 Patient's Name and Date of Birth confirmed.  Transition Care Management Follow-up Telephone Call Date of Discharge: 12/13/23 Discharge Facility: [facility]) Type of Discharge: Inpatient Admission Primary Inpatient Discharge Diagnosis:: diplopia How have you been since you were released from the hospital?: Same Any questions or concerns?: No  Items Reviewed: Did you receive and understand the discharge instructions provided?: Yes Medications obtained,verified, and reconciled?: Yes (Medications Reviewed) Any new allergies since your discharge?: No Dietary orders reviewed?: Yes Do you have support at home?: Yes People in Home [RPT]: spouse, child(ren), adult  Medications Reviewed Today: Medications Reviewed Today     Reviewed by Karena Addison, LPN (Licensed Practical Nurse) on 12/14/23 at [phone]  Med List Status: <None>   Medication Order Taking? Sig Documenting Provider Last Dose Status Informant           No Medications to Display                            Home Care and Equipment/Supplies: Were Home Health Services Ordered?: NA Any new equipment or medical supplies ordered?: NA  Functional Questionnaire: Do you need assistance with bathing/showering or dressing?: No Do you need assistance with meal preparation?: No Do you need assistance with eating?: No Do you have difficulty maintaining continence: No Do you need assistance with getting out of bed/getting out of a chair/moving?: No Do you have difficulty managing or taking your medications?: No  Follow up appointments reviewed: PCP Follow-up appointment confirmed?: Yes Date of PCP follow-up appointment?: 01/05/24 Follow-up Provider: [facility] Follow-up appointment confirmed?: No Reason Specialist  Follow-Up Not Confirmed: Patient has Specialist Provider Number and will Call for Appointment Do you need transportation to your follow-up appointment?: No Do you understand care options if your condition(s) worsen?: Yes-patient verbalized understanding    SIGNATURE Karena Addison, LPN [facility] Nurse Health Advisor Direct Dial [phone]

## 2023-12-15 ENCOUNTER — Telehealth: Payer: Self-pay | Admitting: Nurse Practitioner

## 2023-12-15 NOTE — Telephone Encounter (Signed)
error 

## 2023-12-15 NOTE — Telephone Encounter (Signed)
 Left message for patient to return call.

## 2023-12-15 NOTE — Telephone Encounter (Signed)
 Copied from CRM [id number]. Topic: General - Other >> Dec 15, 2023  3:29 PM [place] E wrote: Reason for CRM: Patient called stating she missed a call from the office, agent looked in chart and this was in regards to questions about her FMLA paperwork. Agent asked patient the questions that PCP put in chart and these were her responses: 1. Does she need continuous length of time off? Patient stated yes, but unsure of how long. 2. When was the last day she worked? Monday, March 31st. 3. Is she back to work yet? No, not back to work yet.

## 2023-12-15 NOTE — Telephone Encounter (Signed)
 Patient dropped off document FMLA, to be filled out by provider. Patient requested to send it back via Call Patient to pick up within 7-days. Document is located in providers tray at front office.Please advise at Mobile [phone] (mobile)   Pt husband dropped off paperwork. I put in the dr box

## 2023-12-16 DIAGNOSIS — Z0279 Encounter for issue of other medical certificate: Secondary | ICD-10-CM

## 2023-12-16 LAB — ACHR ABS WITH REFLEX TO MUSK: AChR Binding Ab, Serum: <0.07 nmol/L (ref 0.00–0.24)

## 2023-12-16 LAB — MUSK ANTIBODIES: MuSK Antibodies: <1 U/mL

## 2023-12-16 NOTE — Telephone Encounter (Signed)
 I called and spoke with patient and notified her that paperwork completed and ready to be picked up.   CLINICAL USE BELOW THIS LINE (use X to signify action taken)  ___ Form received and placed in providers office for signature. ___ Form completed and faxed to LOA Dept.  _x__Form completed & LVM to notify patient ready for pick up.  _x__Charge sheet and copy of form in front office folder for office supervisor.

## 2023-12-26 NOTE — Progress Notes (Signed)
 [facility]  PATIENT: Erika Mercer DOB: 12/14/1970  REFERRING DOCTOR OR PCP:  Rheba Cedar, NP SOURCE: Patient, notes from emergency room and hospital admission, imaging and lab reports, MRI images personally reviewed.  _________________________________   HISTORICAL  CHIEF COMPLAINT:  Chief Complaint  Patient presents with   New Patient (Initial Visit)    Pt in room 11. Ron husband in room. Internal ED referral for MS. Pt is having double vision which is better, was having vertigo which has stopped.  Last eye exam in March. No family history of MS.    HISTORY OF PRESENT ILLNESS:  I had the pleasure of seeing your patient, Erika Mercer at the MS center at [facility] for neurologic consultation regarding her facial numbness, abnormal imaging studies and concern about MS.  She is a 53 year old woman who had the onset of dizziness, vertigo and diplopia on 11/18/2023 (noted upon wakening and persistent).  At the time she woke up with a room spinning sensation.  Symptoms were constant and worsened on peripheral gaze.  Diplopia was both horizontal and vertical.  A couple days later, she saw Urgent care and meclizine  was prescribed.   She saw her optometrist and diplopia was able to be corrected with prism  glasses.  She saw her primary care provider who obtained labs and ordered an MRI of the brain and referred her to neurology.  An MRI 12/05/2023 showed enhancement of the posterior following/facial colliculus.  She saw her Dr. Reyna Cava who advised her to return to the emergency room for lumbar puncture.   She was admitted and received IV solumedrol.  Currently, diplopia is better but she stiull has some looking laterally right.   Prism  glasses are not needed but she has cloudy taped the nasal half of the left lens of her glasses.    Color vision is fine.  Balance is ok, she is using the bannister due to depth perception.   No facial or body numbness or  paresthesia.     Bladder function is fine  No change in cognition.   She has had mild fatigue that is better.  Mood is fine.    No preceding viral syndrome or vaccination within a month of onset.     Imaging: MRI of the brain 12/05/2023 showed T2 hyperintense foci in the facial colliculus.  This enhanced after contrast (best seen on the sagittal images) additionally in the hemispheres there are couple scattered T2/FLAIR hyperintense foci in the subcortical or deep white matter.  None of these other foci enhance.  MRI of the cervical and thoracic spine 12/08/2023 showed a normal spinal cord and no significant degenerative change.   CSF 12/09/2023 showed elevated IgG index of 1.1.  Oligoclonal bands were still pending.   No FH of MS   No personal history of autoimmune disease     REVIEW OF SYSTEMS: Constitutional: No fevers, chills, sweats, or change in appetite Eyes: No visual changes, double vision, eye pain Ear, nose and throat: No hearing loss, ear pain, nasal congestion, sore throat Cardiovascular: No chest pain, palpitations Respiratory:  No shortness of breath at rest or with exertion.   No wheezes GastrointestinaI: No nausea, vomiting, diarrhea, abdominal pain, fecal incontinence Genitourinary:  No dysuria, urinary retention or frequency.  No nocturia. Musculoskeletal:  No neck pain, back pain Integumentary: No rash, pruritus, skin lesions Neurological: as above Psychiatric: No depression at this time.  No anxiety Endocrine: No palpitations, diaphoresis, change in appetite, change in weigh or  increased thirst Hematologic/Lymphatic:  No anemia, purpura, petechiae. Allergic/Immunologic: No itchy/runny eyes, nasal congestion, recent allergic reactions, rashes  ALLERGIES: Allergies  Allergen Reactions   Promethazine Anxiety    Restless leg    HOME MEDICATIONS: No current outpatient medications on file.  PAST MEDICAL HISTORY: Past Medical History:  Diagnosis Date   Anxiety     Atrial fibrillation (HCC)    Depression     PAST SURGICAL HISTORY: Past Surgical History:  Procedure Laterality Date   CESAREAN SECTION  03/03/1995   x2    FAMILY HISTORY: Family History  Problem Relation Age of Onset   Healthy Mother    Cancer Father        prostate, leukemia   Diabetes Father    Intellectual disability Sister        down syndrome   Asthma Brother    ADD / ADHD Maternal Uncle    Breast cancer Maternal Grandmother    Cancer Maternal Grandmother        breast   ADD / ADHD Son     SOCIAL HISTORY: Social History   Socioeconomic History   Marital status: Married    Spouse name: Not on file   Number of children: 2   Years of education: Not on file   Highest education level: Associate degree: occupational, Scientist, product/process development, or vocational program  Occupational History   Not on file  Tobacco Use   Smoking status: Never   Smokeless tobacco: Never  Vaping Use   Vaping status: Never Used  Substance and Sexual Activity   Alcohol use: Not Currently   Drug use: Never   Sexual activity: Not Currently  Other Topics Concern   Not on file  Social History Narrative   Are you right handed or left handed? Right Handed    Are you currently employed ? 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 No    Who lives with you? Husband - Ron    What type of home do you live in: 1 story or 2 story? Lives in a 3 story home       Social Drivers of Health   Financial Resource Strain: Low Risk  (11/30/2023)   Overall Financial Resource Strain (CARDIA)    Difficulty of Paying Living Expenses: Not hard at all  Food Insecurity: No Food Insecurity (12/08/2023)   Hunger Vital Sign    Worried About Running Out of Food in the Last Year: Never true    Ran Out of Food in the Last Year: Never true  Transportation Needs: No Transportation Needs (12/08/2023)   PRAPARE - Administrator, Civil Service (Medical): No    Lack of  Transportation (Non-Medical): No  Physical Activity: Insufficiently Active (11/30/2023)   Exercise Vital Sign    Days of Exercise per Week: 1 day    Minutes of Exercise per Session: 10 min  Stress: No Stress Concern Present (11/30/2023)   [employer] of Occupational Health - Occupational Stress Questionnaire    Feeling of Stress : Only a little  Social Connections: Moderately Isolated (12/08/2023)   Social Connection and Isolation Panel [NHANES]    Frequency of Communication with Friends and Family: Twice a week    Frequency of Social Gatherings with Friends and Family: Once a week    Attends Religious Services: Never    Database administrator or Organizations: No    Attends Banker Meetings:  Never    Marital Status: Married  Catering manager Violence: Not At Risk (12/08/2023)   Humiliation, Afraid, Rape, and Kick questionnaire    Fear of Current or Ex-Partner: No    Emotionally Abused: No    Physically Abused: No    Sexually Abused: No       PHYSICAL EXAM  Vitals:   12/27/23 0835  BP: 121/72  Pulse: 66  Weight: 276 lb (125.2 kg)  Height: 5\' 6"  (1.676 m)    Body mass index is 44.55 kg/m.   Vision Screening   Right eye Left eye Both eyes  Without correction     With correction 20/50 20/20 2020    General: The patient is well-developed and well-nourished and in no acute distress  HEENT:  Head is [place]/AT.  Sclera are anicteric.  Funduscopic exam shows normal optic discs and retinal vessels.  Neck: No carotid bruits are noted.  The neck is nontender.  Cardiovascular: The heart has a regular rate and rhythm with a normal S1 and S2. There were no murmurs, gallops or rubs.    Skin: Extremities are without rash or  edema.  Musculoskeletal:  Back is nontender  Neurologic Exam  Mental status: The patient is alert and oriented x 3 at the time of the examination. The patient has apparent normal recent and remote memory, with an apparently normal attention  span and concentration ability.   Speech is normal.  Cranial nerves: She has diplopia on right gaze only.  There is slight disconjugate gaze when she looks to the right not present at primary gaze or looking left . Pupils are equal, round, and reactive to light and accomodation.  Visual fields are full.  Facial symmetry is present. There is good facial sensation to soft touch bilaterally.Facial strength is normal.  Trapezius and sternocleidomastoid strength is normal. No dysarthria is noted.  The tongue is midline, and the patient has symmetric elevation of the soft palate. No obvious hearing deficits are noted.  Motor:  Muscle bulk is normal.   Tone is normal. Strength is  5 / 5 in all 4 extremities.   Sensory: Sensory testing is intact to pinprick, soft touch and vibration sensation in all 4 extremities.  Coordination: Cerebellar testing reveals good finger-nose-finger and heel-to-shin bilaterally.  Gait and station: Station is normal.   Gait is normal. Tandem gait is normal. Romberg is negative.   Reflexes: Deep tendon reflexes are symmetric and normal bilaterally.   Plantar responses are flexor.    DIAGNOSTIC DATA (LABS, IMAGING, TESTING) - I reviewed patient records, labs, notes, testing and imaging myself where available.  Lab Results  Component Value Date   WBC 19.9 (H) 12/11/2023   HGB 14.0 12/11/2023   HCT 41.9 12/11/2023   MCV 86.9 12/11/2023   PLT 336 12/11/2023      Component Value Date/Time   NA 143 12/11/2023 0605   K 4.1 12/11/2023 0605   CL 109 12/11/2023 0605   CO2 26 12/11/2023 0605   GLUCOSE 156 (H) 12/11/2023 0605   BUN 12 12/11/2023 0605   CREATININE 0.87 12/11/2023 0605   CALCIUM 9.6 12/11/2023 0605   PROT 6.5 12/08/2023 0647   ALBUMIN 4.3 12/09/2023 0950   AST 22 12/08/2023 0647   ALT 21 12/08/2023 0647   ALKPHOS 38 12/08/2023 0647   BILITOT 0.7 12/08/2023 0647   GFRNONAA >60 12/11/2023 0605   Lab Results  Component Value Date   CHOL 138  07/13/2023   HDL 54.50 07/13/2023  LDLCALC 69 07/13/2023   TRIG 76.0 07/13/2023   CHOLHDL 3 07/13/2023   Lab Results  Component Value Date   HGBA1C 5.9 07/13/2023   No results found for: "VITAMINB12" Lab Results  Component Value Date   TSH 4.155 12/08/2023       ASSESSMENT AND PLAN  Demyelinating disease (HCC) - Plan: Anti-MOG, Serum, Angiotensin converting enzyme, ANCA Profile  Double vision - Plan: Anti-MOG, Serum, Angiotensin converting enzyme, ANCA Profile  High risk medication use  Ms. Mun is a 53 year old woman who had the onset of diplopia with vertigo on 11/18/2023.  She received high-dose steroids.  Symptoms are better with resolution of the vertigo and diplopia only on full left gaze at this time.  I suspect that the diplopia will further improve over the next few weeks.  Her exam was otherwise fairly normal.    We had a discussion about the significance of the brain MRI showing a possible demyelinating lesion.  CSF did show elevated IgG index which is often seen in MS.  Unfortunately we do not yet have the oligoclonal band results and these have been requested.  Hopefully this was not lost.  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  I discussed with her that if the oligoclonal bands come back positive that I then feel the likelihood that she has MS is greater than 90%.  If negative, there is still a good possibility of MS.  If negative we will want to recheck an MRI again later this year and probably an additional time in a year or so if it does not show any new lesions.  If it did show new lesions that would also be consistent with multiple sclerosis.  I will also check some lab work to rule out MS mimics.  Since the possibility of MS is high, I did go over some treatment options with her (fumarates, teriflunomide, cladribine)  She will return to see me in 6 months or sooner if she has new or worsening neurologic symptoms or based on the results of the  studies.     Aizley Stenseth A. Godwin Lat, MD, [address] [employer] 12/27/2023, 1:02 PM Certified in Neurology, Clinical Neurophysiology, Sleep Medicine and Neuroimaging  [facility] [address] [place], [place] [address] [phone]

## 2023-12-27 ENCOUNTER — Encounter: Payer: Self-pay | Admitting: Neurology

## 2023-12-27 ENCOUNTER — Ambulatory Visit (INDEPENDENT_AMBULATORY_CARE_PROVIDER_SITE_OTHER): Admitting: Neurology

## 2023-12-27 VITALS — BP 121/72 | HR 66 | Ht 66.0 in | Wt 276.0 lb

## 2023-12-27 DIAGNOSIS — G379 Demyelinating disease of central nervous system, unspecified: Secondary | ICD-10-CM | POA: Diagnosis not present

## 2023-12-27 DIAGNOSIS — Z79899 Other long term (current) drug therapy: Secondary | ICD-10-CM

## 2023-12-27 DIAGNOSIS — H532 Diplopia: Secondary | ICD-10-CM | POA: Diagnosis not present

## 2023-12-27 LAB — OLIGOCLONAL BANDS, CSF + SERM

## 2023-12-28 ENCOUNTER — Telehealth: Payer: Self-pay | Admitting: *Deleted

## 2023-12-28 ENCOUNTER — Encounter: Payer: Self-pay | Admitting: Neurology

## 2023-12-28 DIAGNOSIS — G379 Demyelinating disease of central nervous system, unspecified: Secondary | ICD-10-CM

## 2023-12-28 NOTE — Telephone Encounter (Signed)
 PA for Mavenclad (10 tablets) completed via CMM and sent to [facility]. Key: [id number]. Should have a determination withing 3-5 business days.

## 2023-12-28 NOTE — Telephone Encounter (Signed)
Faxed completed/signed Mavenclad start form to [employer] at [phone]. Received fax confirmation.

## 2023-12-29 ENCOUNTER — Encounter: Payer: Self-pay | Admitting: Neurology

## 2023-12-29 LAB — ANCA PROFILE

## 2023-12-29 LAB — ANTI-MOG, SERUM: MOG Antibody, Cell-based IFA: NEGATIVE

## 2023-12-29 LAB — ANGIOTENSIN CONVERTING ENZYME: Angio Convert Enzyme: 22 U/L (ref 14–82)

## 2023-12-29 MED ORDER — DIMETHYL FUMARATE STARTER PACK 120 & 240 MG PO CDPK: DELAYED_RELEASE_CAPSULE | ORAL | 0 refills | Status: AC

## 2023-12-29 MED ORDER — DIMETHYL FUMARATE 240 MG PO CPDR
DELAYED_RELEASE_CAPSULE | ORAL | 3 refills | Status: AC
Start: 2023-12-29 — End: ?

## 2023-12-29 NOTE — Telephone Encounter (Signed)
 PA for [facility] denied but no formulary alternatives offered. Attempted PA for dimethyl fumarate  to see if that is a formulary option to consider for her. Waiting on clinical questions. Key: [id number].

## 2023-12-29 NOTE — Telephone Encounter (Signed)
 Erika Mercer

## 2023-12-29 NOTE — Telephone Encounter (Signed)
 Called [employer] at [phone]. Spoke w/ Jade. She will cx Mavenclad case. Confirmed [facility]clad [facility]. I called them at [phone]. Spoke w/ rep who transferred me to pharmacist. Eldora Greet w/ [facility]. Cx rx Mavenclad on file.  E-scribed titration/maintenance dose of dimethyl f[facility]ate  [facility].

## 2023-12-29 NOTE — Addendum Note (Signed)
 Addended by: Oneita Bihari on: 12/29/2023 05:07 PM   Modules accepted: Orders

## 2023-12-29 NOTE — Telephone Encounter (Signed)
 Called [facility] at [phone]. Spoke w/ Tricia/PA tech. Relayed I need to speak with specialty drug department [phone]) and transferred me. Spoke w/ Vernida Goodie. She states based on denial PA# [id number] covered options: Rebif, Tecfidera , Copaxone. She transferred to clinician to get full list of covered options. Spoke w/ Bartholomew Light.  Stating I have to call pt insurance directly to confirm what is listed on her formulary plan. Total time of call: 28 min   I call pt insurance plan/[employer] at [phone] option 3 then option 8 then option 2. Spoke w/ Amaya. States we need to call plan for complete listing of covered drugs/[facility]. Phone number: [phone]. Called this phone# and spoke w/ Tiana Flurry. Confirmed formulary options, see below. Pharmacy direct #: [phone]. Total time of call: 20 min  On formulary: Avonex, Betaseron, glatiramer acetate (generic covered), dimethyl fumarate , fingolimod, Kesimpta, Mayzent, Ocrevus, Rebif, Teriflunomide, Tysabri, Zeposia, Vumerity  Covered but has to try other drugs first: Mavenclad (Rebif, Tecfidera , Copaxone)  However, with her plan, they are unable to know what covered drugs are until we send in drug and PA and then they will tell us  what she has to try/fail first if not covered.   Per MD, ok to try placing pt on Dimethyl fumarate  if Mavenclad denied. Starling Eck, RN will work on PA to see if we can get approval for this.

## 2023-12-29 NOTE — Telephone Encounter (Signed)
 PA for dimethyl fumarate  sent to [facility]. Key: [id number]. Should have a determination within 24 hours.

## 2024-01-05 ENCOUNTER — Ambulatory Visit: Admitting: Nurse Practitioner

## 2024-01-05 ENCOUNTER — Encounter: Payer: Self-pay | Admitting: Nurse Practitioner

## 2024-01-05 VITALS — BP 128/88 | HR 70 | Temp 97.3°F | Ht 66.0 in | Wt 279.0 lb

## 2024-01-05 DIAGNOSIS — F32A Depression, unspecified: Secondary | ICD-10-CM | POA: Diagnosis not present

## 2024-01-05 DIAGNOSIS — G35 Multiple sclerosis: Secondary | ICD-10-CM

## 2024-01-05 DIAGNOSIS — F419 Anxiety disorder, unspecified: Secondary | ICD-10-CM

## 2024-01-05 MED ORDER — SERTRALINE HCL 25 MG PO TABS: 25.0000 mg | ORAL_TABLET | Freq: Every day | ORAL | 0 refills | Status: DC

## 2024-01-05 NOTE — Assessment & Plan Note (Signed)
 Anxiety and depression may be exacerbated by the MS diagnosis. Restart sertraline  25mg  taking one tablet daily. Follow-up in 6 months or sooner with concerns.

## 2024-01-05 NOTE — Progress Notes (Signed)
 Established Patient Office Visit  Subjective   Patient ID: Erika Mercer, female    DOB: 18-Jan-1971  Age: 53 y.o. MRN: [id number]  Chief Complaint  Patient presents with   Double Vision    Follow up    HPI  Discussed the use of AI scribe software for clinical note transcription with the patient, who gave verbal consent to proceed.  History of Present Illness   Erika Mercer is a 53 year old female with multiple sclerosis who presents for follow-up after recent diagnosis and treatment initiation.  She experiences double vision, particularly on the right side, and nystagmus that worsens with stimuli and when looking to the right. These symptoms are noticeable during activities such as grocery shopping. She uses tape on her glasses to strengthen her right eye, which has been beneficial.  She is concerned about potential side effects of her new medication, including flushing, diarrhea, and gastrointestinal upset, and plans to take the medication with food to mitigate these effects.   She has anxiety and depression and is considering restarting Zoloft  to manage her mental health, especially in light of her recent MS diagnosis. She denies SI/HI.        01/05/2024    8:52 AM 07/13/2023    8:11 AM 05/13/2023    9:08 AM  Depression screen PHQ 2/9  Decreased Interest 1 2 0  Down, Depressed, Hopeless 1 1 1   PHQ - 2 Score 2 3 1   Altered sleeping 0 0 0  Tired, decreased energy 1 3 2   Change in appetite 2 3 2   Feeling bad or failure about yourself  0 2 0  Trouble concentrating 0 0 0  Moving slowly or fidgety/restless 0 0 0  Suicidal thoughts 0 0 0  PHQ-9 Score 5 11 5   Difficult doing work/chores  Not difficult at all Not difficult at all      01/05/2024    8:53 AM 07/13/2023    8:11 AM 05/13/2023    9:08 AM  GAD 7 : Generalized Anxiety Score  Nervous, Anxious, on Edge 1 1 1   Control/stop worrying 1 0 0  Worry too much - different things 1 0 1  Trouble relaxing 0 0 0  Restless 0 0 0   Easily annoyed or irritable 0 0 0  Afraid - awful might happen 0 0 0  Total GAD 7 Score 3 1 2   Anxiety Difficulty  Not difficult at all Not difficult at all      ROS See pertinent positives and negatives per HPI.   Objective:     BP 128/88 (BP Location: Right Arm, Patient Position: Sitting, Cuff Size: Large)   Pulse 70   Temp (!) 97.3 F (36.3 C)   Ht 5\' 6"  (1.676 m)   Wt 279 lb (126.6 kg)   SpO2 98%   BMI 45.03 kg/m  BP Readings from Last 3 Encounters:  01/05/24 128/88  12/27/23 121/72  12/13/23 (!) 153/69   Wt Readings from Last 3 Encounters:  01/05/24 279 lb (126.6 kg)  12/27/23 276 lb (125.2 kg)  12/12/23 277 lb 5.4 oz (125.8 kg)      Physical Exam Vitals and nursing note reviewed.  Constitutional:      General: She is not in acute distress.    Appearance: Normal appearance.  HENT:     Head: Normocephalic.  Eyes:     Conjunctiva/sclera: Conjunctivae normal.  Cardiovascular:     Rate and Rhythm: Normal rate and regular rhythm.  Pulses: Normal pulses.     Heart sounds: Normal heart sounds.  Pulmonary:     Effort: Pulmonary effort is normal.     Breath sounds: Normal breath sounds.  Musculoskeletal:     Cervical back: Normal range of motion.  Skin:    General: Skin is warm.  Neurological:     General: No focal deficit present.     Mental Status: She is alert and oriented to person, place, and time.  Psychiatric:        Mood and Affect: Mood normal.        Behavior: Behavior normal.        Thought Content: Thought content normal.        Judgment: Judgment normal.     The 10-year ASCVD risk score (Arnett DK, et al., 2019) is: 1.2%    Assessment & Plan:   Problem List Items Addressed This Visit       Nervous and Auditory   MS (multiple sclerosis) (HCC) - Primary   Recently diagnosed MS confirmed by MRI and lumbar puncture. Symptoms of diplopia and nystagmus have improved by 80% with IV steroids. She will be starting Dimethyl Fumarate  on  Friday. Follow up with neurology in six months. Monitor for side effects and contact neurology if issues arise.        Other   Anxiety and depression   Anxiety and depression may be exacerbated by the MS diagnosis. Restart sertraline  25mg  taking one tablet daily. Follow-up in 6 months or sooner with concerns.       Relevant Medications   sertraline  (ZOLOFT ) 25 MG tablet    Return in about 7 months (around 08/06/2024) for CPE.    Odette Benjamin, NP

## 2024-01-05 NOTE — Assessment & Plan Note (Signed)
 Recently diagnosed MS confirmed by MRI and lumbar puncture. Symptoms of diplopia and nystagmus have improved by 80% with IV steroids. She will be starting Dimethyl Fumarate  on Friday. Follow up with neurology in six months. Monitor for side effects and contact neurology if issues arise.

## 2024-01-05 NOTE — Patient Instructions (Signed)
 It was great to see you!  Start zoloft  1 tablet daily  Call your pharmacy when you need a refill   Let's follow-up in 7 months, sooner if you have concerns.  If a referral was placed today, you will be contacted for an appointment. Please note that routine referrals can sometimes take up to 3-4 weeks to process. Please call our office if you haven't heard anything after this time frame.  Take care,  Rheba Cedar, NP

## 2024-04-04 ENCOUNTER — Encounter: Payer: Self-pay | Admitting: Nurse Practitioner

## 2024-04-04 ENCOUNTER — Ambulatory Visit: Admitting: Nurse Practitioner

## 2024-04-04 VITALS — BP 116/80 | HR 60 | Temp 96.8°F | Ht 66.0 in | Wt 276.6 lb

## 2024-04-04 DIAGNOSIS — B070 Plantar wart: Secondary | ICD-10-CM

## 2024-04-04 NOTE — Assessment & Plan Note (Signed)
 The plantar wart has been present for three months. Cryotherapy was performed today. She was advised about potential blistering and instructed to avoid picking at the area. If the wart persists after two weeks, she should use an over-the-counter treatment by applying it for two days, removing it, and repeating as needed. If the over-the-counter treatment fails, she should return for further cryotherapy. See procedure note below.

## 2024-04-04 NOTE — Progress Notes (Signed)
 Acute Office Visit  Subjective:     Patient ID: Erika Mercer, female    DOB: [birth year], 53 y.o.   MRN: [id number]  Chief Complaint  Patient presents with   Plantar Warts    Left heel, request to be removed    HPI Discussed the use of AI scribe software for clinical note transcription with the patient, who gave verbal consent to proceed.  History of Present Illness   Erika Mercer is a 53 year old female who presents with a wart on the bottom of her left foot.  The wart on the plantar surface of her left foot has been present for approximately three months. It is painful, especially when standing, but does not itch or drain. There has been no significant change in size. She occasionally picks at it. She has not attempted any home treatments for the current wart. She had similar warts about ten years ago, which were unsuccessfully treated with over-the-counter options and eventually removed by a physician.      ROS See pertinent positives and negatives per HPI.     Objective:    BP 116/80 (BP Location: Right Arm, Patient Position: Sitting, Cuff Size: Large)   Pulse 60   Temp (!) 96.8 F (36 C)   Ht 5' 6 (1.676 m)   Wt 276 lb 9.6 oz (125.5 kg)   SpO2 97%   BMI 44.64 kg/m    Physical Exam Vitals and nursing note reviewed.  Constitutional:      General: She is not in acute distress.    Appearance: Normal appearance.  HENT:     Head: Normocephalic.  Eyes:     Conjunctiva/sclera: Conjunctivae normal.  Pulmonary:     Effort: Pulmonary effort is normal.  Musculoskeletal:     Cervical back: Normal range of motion.  Skin:    General: Skin is warm.     Comments: Small plantar wart to left heel  Neurological:     General: No focal deficit present.     Mental Status: She is alert and oriented to person, place, and time.  Psychiatric:        Mood and Affect: Mood normal.        Behavior: Behavior normal.        Thought Content: Thought content normal.         Judgment: Judgment normal.      Assessment & Plan:   Problem List Items Addressed This Visit       Musculoskeletal and Integument   Plantar wart - Primary   The plantar wart has been present for three months. Cryotherapy was performed today. She was advised about potential blistering and instructed to avoid picking at the area. If the wart persists after two weeks, she should use an over-the-counter treatment by applying it for two days, removing it, and repeating as needed. If the over-the-counter treatment fails, she should return for further cryotherapy. See procedure note below.        Skin Procedure  Procedure: Plantar wart destruction with histofreeze Diagnosis:   ICD-10-CM   1. Plantar wart  B07.0       Lesion Location/Size: Left heel - 2mm Provider: Tinnie Harada, NP Consent:  Risks, benefits, and alternative treatments discussed and all questions were answered.  Patient elected to proceed and verbal consent obtained.  Description: Area prepped and draped using semi-sterile technique. A  Liquid nitrogen applied to lesion for 1 cycle.  Cryotherapy performed on a total of 1 lesion.  Wound dressed after application of bacitracin ointment.  Complications: None Estimated Blood Loss: None Post Procedure Instructions: Wound care instructions discussed and patient was instructed to keep area clean and dry.  Signs and symptoms of infection discussed, patient agrees to contact the office ASAP should they occur. Patient informed that areas of cryotherapy possible will blister.  Encouraged avoidance of popping blister.  Application of  bacitracin recommended should blistering occur.  Signs and symptoms of infection discussed and patient encouraged to contact the clinic ASAP for concerns.  Follow Up: with any concerns  No orders of the defined types were placed in this encounter.   Return if symptoms worsen or fail to improve.  Tinnie DELENA Harada, NP

## 2024-04-04 NOTE — Patient Instructions (Signed)
 It was great to see you!  Wash the area with soap and water daily.   If a blister develops - do not pop it. Apply bacitracin or neosporin and cover with a bandaid  Let me know if you have any fevers, redness, drainage  You may need to repeat this in 2 weeks.   You can also try over the counter wart removers - I like [employer] brand.     Put a strip on for 2 days, then take it off, remove any dead skin and put a new one on for 2 days until completely gone.   Let's follow-up with any concerns  Take care,  Tinnie Harada, NP

## 2024-06-11 ENCOUNTER — Ambulatory Visit
Admission: EM | Admit: 2024-06-11 | Discharge: 2024-06-11 | Disposition: A | Discharge status: Home / Self Care | Attending: Family Medicine | Admitting: Family Medicine

## 2024-06-11 ENCOUNTER — Other Ambulatory Visit: Payer: Self-pay

## 2024-06-11 DIAGNOSIS — J22 Unspecified acute lower respiratory infection: Secondary | ICD-10-CM

## 2024-06-11 HISTORY — DX: Multiple sclerosis, unspecified: G35.D

## 2024-06-11 MED ORDER — BENZONATATE 200 MG PO CAPS: 200.0000 mg | ORAL_CAPSULE | Freq: Three times a day (TID) | ORAL | 0 refills | Status: DC | PRN

## 2024-06-11 MED ORDER — FLUTICASONE PROPIONATE 50 MCG/ACT NA SUSP: 1.0000 | Freq: Every day | NASAL | 0 refills | Status: DC

## 2024-06-11 MED ORDER — AMOXICILLIN-POT CLAVULANATE 875-125 MG PO TABS: 1.0000 | ORAL_TABLET | Freq: Two times a day (BID) | ORAL | 0 refills | Status: DC

## 2024-06-11 NOTE — ED Provider Notes (Addendum)
 [facility]    CSN: [id number] Arrival date & time: 06/11/24  9185      History   Chief Complaint Chief Complaint  Patient presents with   Cough   Sore Throat   Facial Pain    HPI Erika Mercer is a 53 y.o. female  presents for evaluation of URI symptoms for 6 days. Patient reports associated symptoms of cough, congestion, ear pain, sore throat, sinus pressure, laryngitis, low-grade fevers. Denies N/V/D, body aches or shortness of breath. Patient does not have a hx of asthma. Patient is not an active smoker.  Pt has taken DayQuil OTC for symptoms. Pt has no other concerns at this time.    Cough Associated symptoms: ear pain, fever and sore throat   Sore Throat    Past Medical History:  Diagnosis Date   Anxiety    Atrial fibrillation [facility])    Depression    MS (multiple sclerosis)     Patient Active Problem List   Diagnosis Date Noted   Plantar wart 04/04/2024   MS (multiple sclerosis) 12/10/2023   Diplopia 12/08/2023   Demyelinating disease (HCC) 12/08/2023   Double vision 12/01/2023   Elevated blood pressure reading 12/01/2023   Vertigo 12/01/2023   Routine general medical examination at a health care facility 07/13/2023   Anxiety and depression 05/13/2023   Morbid obesity (HCC) 05/13/2023   Prediabetes 07/24/2020   Morbid obesity with BMI of 40.0-44.9, adult (HCC) 07/23/2020   Atrial fibrillation (HCC) 07/23/2020   Anxiety 07/23/2020    Past Surgical History:  Procedure Laterality Date   CESAREAN SECTION  03/03/1995   x2    OB History   No obstetric history on file.      Home Medications    Prior to Admission medications   Medication Sig Start Date End Date Taking? Authorizing Provider  amoxicillin -clavulanate (AUGMENTIN ) 875-125 MG tablet Take 1 tablet by mouth every 12 (twelve) hours. 06/11/24  Yes Henrik Orihuela, Jodi R, NP  benzonatate (TESSALON) 200 MG capsule Take 1 capsule (200 mg total) by mouth 3 (three) times daily as needed. 06/11/24   Yes Mammie Meras, Jodi R, NP  fluticasone  (FLONASE ) 50 MCG/ACT nasal spray Place 1 spray into both nostrils daily. 06/11/24  Yes Loreda Myla SAUNDERS, NP  Dimethyl Fumarate  240 MG CPDR Take 1 capsule by mouth twice daily 12/29/23   Sater, Charlie LABOR, MD  sertraline  (ZOLOFT ) 25 MG tablet Take 1 tablet (25 mg total) by mouth daily. 01/05/24   McElwee, Tinnie LABOR, NP    Family History Family History  Problem Relation Age of Onset   Healthy Mother    Cancer Father        prostate, leukemia   Diabetes Father    Intellectual disability Sister        down syndrome   Asthma Brother    ADD / ADHD Maternal Uncle    Breast cancer Maternal Grandmother    Cancer Maternal Grandmother        breast   ADD / ADHD Son     Social History Social History   Tobacco Use   Smoking status: Never   Smokeless tobacco: Never  Vaping Use   Vaping status: Never Used  Substance Use Topics   Alcohol use: Not Currently   Drug use: Never     Allergies   Promethazine   Review of Systems Review of Systems  Constitutional:  Positive for fever.  HENT:  Positive for congestion, ear pain, sinus pressure and sore throat.  Respiratory:  Positive for cough.      Physical Exam Triage Vital Signs ED Triage Vitals  Encounter Vitals Group     BP 06/11/24 0824 132/74     Girls Systolic BP Percentile --      Girls Diastolic BP Percentile --      Boys Systolic BP Percentile --      Boys Diastolic BP Percentile --      Pulse Rate 06/11/24 0824 92     Resp 06/11/24 0824 17     Temp 06/11/24 0824 99.2 F (37.3 C)     Temp Source 06/11/24 0824 Oral     SpO2 06/11/24 0824 98 %     Weight --      Height --      Head Circumference --      Peak Flow --      Pain Score 06/11/24 0822 7     Pain Loc --      Pain Education --      Exclude from Growth Chart --    No data found.  Updated Vital Signs BP 132/74   Pulse 92   Temp 99.2 F (37.3 C) (Oral)   Resp 17   SpO2 98%   Visual Acuity Right Eye Distance:   Left  Eye Distance:   Bilateral Distance:    Right Eye Near:   Left Eye Near:    Bilateral Near:     Physical Exam Vitals and nursing note reviewed.  Constitutional:      General: She is not in acute distress.    Appearance: She is well-developed. She is not ill-appearing.  HENT:     Head: Normocephalic and atraumatic.     Right Ear: Tympanic membrane and ear canal normal.     Left Ear: Tympanic membrane and ear canal normal.     Nose: Congestion present.     Mouth/Throat:     Mouth: Mucous membranes are moist.     Pharynx: Oropharynx is clear. Uvula midline. Posterior oropharyngeal erythema present.     Tonsils: No tonsillar exudate or tonsillar abscesses.  Eyes:     Conjunctiva/sclera: Conjunctivae normal.     Pupils: Pupils are equal, round, and reactive to light.  Cardiovascular:     Rate and Rhythm: Normal rate and regular rhythm.     Heart sounds: Normal heart sounds.  Pulmonary:     Effort: Pulmonary effort is normal.     Breath sounds: Normal breath sounds. No wheezing or rhonchi.  Musculoskeletal:     Cervical back: Normal range of motion and neck supple.  Lymphadenopathy:     Cervical: No cervical adenopathy.  Skin:    General: Skin is warm and dry.  Neurological:     General: No focal deficit present.     Mental Status: She is alert and oriented to person, place, and time.  Psychiatric:        Mood and Affect: Mood normal.        Behavior: Behavior normal.      UC Treatments / Results  Labs (all labs ordered are listed, but only abnormal results are displayed) Labs Reviewed - No data to display  Basic metabolic panel with GFR Order: [id number]  Status: Final result     Next appt: 07/24/2024 at 08:30 AM in Radiology ([facility])   Test Result Released: Yes (seen)   0 Result Notes        Component Ref Range & Units (hover) 6  mo ago (12/11/23) 6 mo ago (12/09/23) 6 mo ago (12/08/23) 6 mo ago (12/01/23) 11 mo ago (07/13/23)  Sodium 143 140 139 139 R  141 R  Potassium 4.1 4.6 3.7 4.5 R 4.5 R  Chloride 109 106 108 103 R 104 R  CO2 26 25 22 29  R 31 R  Glucose, Bld 156 High  104 High  CM 101 High  CM 105 High  108 High   Comment: Glucose reference range applies only to samples taken after fasting for at least 8 hours.  BUN 12 10 11 12  R 11 R  Creatinine, Ser 0.87 0.78 0.79 0.65 R 0.71 R  Calcium 9.6 9.2 8.8 Low  9.7 R 9.2 R  GFR, Estimated >60 >60 CM >60 CM    Comment: (NOTE) Calculated using the CKD-EPI Creatinine Equation (2021)  Anion gap 8 9 CM 9 CM    Comment: Performed at [facility], [address] [place], [place] [address]  Resulting Agency [facility] CL[facility] LAB [facility] LAB [place] [facility] [place] [facility]        Specimen Collected: 12/11/23 06:05 Last Resulted: 12/11/23 07:31    EKG   Radiology No results found.  Procedures Procedures (including critical care time)  Medications Ordered in UC Medications - No data to display  Initial Impression / Assessment and Plan / UC Course  I have reviewed the triage vital signs and the nursing notes.  Pertinent labs & imaging results that were available during my care of the patient were reviewed by me and considered in my medical decision making (see chart for details).     Reviewed exam and symptoms with patient.  No red flags.  Will do Augmentin  as well as Flonase  and Tessalon.  Discussed continuing rest fluids and PCP follow-up if symptoms do not improve.  ER precautions reviewed. Final Clinical Impressions(s) / UC Diagnoses   Final diagnoses:  Lower respiratory infection (e.g., bronchitis, pneumonia, pneumonitis, pulmonitis)     Discharge Instructions      Start Augmentin  twice daily for 7 days.  Use Flonase  daily.  You may take Tessalon 3 times a day as needed for your cough.  Lots of rest and fluids.  Please follow-up with your PCP if your symptoms do not improve.  Please go to the ER for any worsening symptoms.  I hope you feel better soon!    ED  Prescriptions     Medication Sig Dispense Auth. Provider   amoxicillin -clavulanate (AUGMENTIN ) 875-125 MG tablet Take 1 tablet by mouth every 12 (twelve) hours. 14 tablet Madesyn Ast, Jodi R, NP   fluticasone  (FLONASE ) 50 MCG/ACT nasal spray Place 1 spray into both nostrils daily. 15.8 mL Neveyah Garzon, Jodi R, NP   benzonatate (TESSALON) 200 MG capsule Take 1 capsule (200 mg total) by mouth 3 (three) times daily as needed. 20 capsule Wilbert Schouten, Jodi R, NP      PDMP not reviewed this encounter.   Loreda Myla SAUNDERS, NP 06/11/24 [id number]    Loreda Myla SAUNDERS, NP 06/11/24 [phone]

## 2024-06-11 NOTE — ED Triage Notes (Signed)
 Pt c/o sore throat, sinus pressure, ear pain bilat, productive cough w/clear mucous, losing voicex6d

## 2024-06-11 NOTE — Discharge Instructions (Signed)
 Start Augmentin  twice daily for 7 days.  Use Flonase  daily.  You may take Tessalon 3 times a day as needed for your cough.  Lots of rest and fluids.  Please follow-up with your PCP if your symptoms do not improve.  Please go to the ER for any worsening symptoms.  I hope you feel better soon!

## 2024-08-01 ENCOUNTER — Ambulatory Visit: Admitting: Neurology

## 2024-08-07 ENCOUNTER — Ambulatory Visit: Payer: Self-pay | Admitting: Nurse Practitioner

## 2024-08-07 ENCOUNTER — Ambulatory Visit: Admitting: Nurse Practitioner

## 2024-08-07 ENCOUNTER — Encounter: Payer: Self-pay | Admitting: Nurse Practitioner

## 2024-08-07 VITALS — BP 126/80 | HR 62 | Temp 97.0°F | Ht 66.0 in | Wt 256.4 lb

## 2024-08-07 DIAGNOSIS — Z136 Encounter for screening for cardiovascular disorders: Secondary | ICD-10-CM | POA: Diagnosis not present

## 2024-08-07 DIAGNOSIS — R7303 Prediabetes: Secondary | ICD-10-CM | POA: Diagnosis not present

## 2024-08-07 DIAGNOSIS — G35D Multiple sclerosis, unspecified: Secondary | ICD-10-CM | POA: Diagnosis not present

## 2024-08-07 DIAGNOSIS — Z0000 Encounter for general adult medical examination without abnormal findings: Secondary | ICD-10-CM

## 2024-08-07 DIAGNOSIS — F32A Depression, unspecified: Secondary | ICD-10-CM

## 2024-08-07 DIAGNOSIS — F419 Anxiety disorder, unspecified: Secondary | ICD-10-CM | POA: Diagnosis not present

## 2024-08-07 LAB — COMPREHENSIVE METABOLIC PANEL WITH GFR
ALT: 22 U/L (ref 0–35)
AST: 18 U/L (ref 0–37)
Albumin: 4.4 g/dL (ref 3.5–5.2)
Alkaline Phosphatase: 42 U/L (ref 39–117)
BUN: 12 mg/dL (ref 6–23)
CO2: 29 meq/L (ref 19–32)
Calcium: 9.6 mg/dL (ref 8.4–10.5)
Chloride: 105 meq/L (ref 96–112)
Creatinine, Ser: 0.6 mg/dL (ref 0.40–1.20)
GFR: 101.16 mL/min (ref >60.00)
Glucose, Bld: 103 mg/dL — ABNORMAL HIGH (ref 70–99)
Potassium: 4.9 meq/L (ref 3.5–5.1)
Sodium: 140 meq/L (ref 135–145)
Total Bilirubin: 0.5 mg/dL (ref 0.2–1.2)
Total Protein: 6.9 g/dL (ref 6.0–8.3)

## 2024-08-07 LAB — LIPID PANEL
Cholesterol: 162 mg/dL (ref 0–200)
HDL: 52.6 mg/dL (ref >39.00)
LDL Cholesterol: 95 mg/dL (ref 0–99)
NonHDL: 109.18
Total CHOL/HDL Ratio: 3
Triglycerides: 69 mg/dL (ref 0.0–149.0)
VLDL: 13.8 mg/dL (ref 0.0–40.0)

## 2024-08-07 LAB — CBC WITH DIFFERENTIAL/PLATELET
Basophils Absolute: 0 K/uL (ref 0.0–0.1)
Basophils Relative: 0.7 % (ref 0.0–3.0)
Eosinophils Absolute: 0.1 K/uL (ref 0.0–0.7)
Eosinophils Relative: 1.2 % (ref 0.0–5.0)
HCT: 42.3 % (ref 36.0–46.0)
Hemoglobin: 14.1 g/dL (ref 12.0–15.0)
Lymphocytes Relative: 29.8 % (ref 12.0–46.0)
Lymphs Abs: 2 K/uL (ref 0.7–4.0)
MCHC: 33.3 g/dL (ref 30.0–36.0)
MCV: 88.8 fl (ref 78.0–100.0)
Monocytes Absolute: 0.4 K/uL (ref 0.1–1.0)
Monocytes Relative: 6.4 % (ref 3.0–12.0)
Neutro Abs: 4.1 K/uL (ref 1.4–7.7)
Neutrophils Relative %: 61.9 % (ref 43.0–77.0)
Platelets: 307 K/uL (ref 150.0–400.0)
RBC: 4.76 Mil/uL (ref 3.87–5.11)
RDW: 15.7 % — ABNORMAL HIGH (ref 11.5–15.5)
WBC: 6.7 K/uL (ref 4.0–10.5)

## 2024-08-07 LAB — HEMOGLOBIN A1C: Hgb A1c MFr Bld: 5.5 % (ref 4.6–6.5)

## 2024-08-07 NOTE — Assessment & Plan Note (Signed)
Check A1c today and treat based on results.  

## 2024-08-07 NOTE — Assessment & Plan Note (Signed)
 BMI 41.3. She lost 25 pounds recently with high protein and low calories diet. Congratulated her on this and encouraged her to keep up the great work.

## 2024-08-07 NOTE — Assessment & Plan Note (Signed)
 Health maintenance reviewed and updated. Discussed nutrition, exercise. Check CMP, CBC today. Follow-up 1 year.

## 2024-08-07 NOTE — Progress Notes (Signed)
 BP 126/80 (BP Location: Right Arm, Patient Position: Sitting, Cuff Size: Large)   Pulse 62   Temp (!) 97 F (36.1 C)   Ht 5' 6 (1.676 m)   Wt 256 lb 6.4 oz (116.3 kg)   SpO2 99%   BMI 41.38 kg/m    Subjective:    Patient ID: Erika Mercer, female    DOB: 1971/01/23, 53 y.o.   MRN: [id number]  CC: Chief Complaint  Patient presents with   Annual Exam    With non-fasting labs, no concerns    HPI: Erika Mercer is a 53 y.o. female presenting on 08/07/2024 for comprehensive medical examination. Current medical complaints include:none  She currently lives with: husband, kids, MIL Menopausal Symptoms: no  Depression and Anxiety Screen done today and results listed below:     08/07/2024    8:53 AM 01/05/2024    8:52 AM 07/13/2023    8:11 AM 05/13/2023    9:08 AM  Depression screen PHQ 2/9  Decreased Interest 0 1 2 0  Down, Depressed, Hopeless 0 1 1 1   PHQ - 2 Score 0 2 3 1   Altered sleeping 0 0 0 0  Tired, decreased energy 0 1 3 2   Change in appetite 0 2 3 2   Feeling bad or failure about yourself  0 0 2 0  Trouble concentrating 0 0 0 0  Moving slowly or fidgety/restless 0 0 0 0  Suicidal thoughts 0 0 0 0  PHQ-9 Score 0 5  11  5    Difficult doing work/chores Not difficult at all  Not difficult at all Not difficult at all     Data saved with a previous flowsheet row definition      08/07/2024    8:53 AM 01/05/2024    8:53 AM 07/13/2023    8:11 AM 05/13/2023    9:08 AM  GAD 7 : Generalized Anxiety Score  Nervous, Anxious, on Edge 0 1 1 1   Control/stop worrying 0 1 0 0  Worry too much - different things 0 1 0 1  Trouble relaxing 0 0 0 0  Restless 0 0 0 0  Easily annoyed or irritable 0 0 0 0  Afraid - awful might happen 0 0 0 0  Total GAD 7 Score 0 3 1 2   Anxiety Difficulty Not difficult at all  Not difficult at all Not difficult at all    The patient does not have a history of falls. I did not complete a risk assessment for falls. A plan of care for falls was not  documented.   Past Medical History:  Past Medical History:  Diagnosis Date   Anxiety    Atrial fibrillation (HCC)    Depression    MS (multiple sclerosis)     Surgical History:  Past Surgical History:  Procedure Laterality Date   CESAREAN SECTION  03/03/1995   x2    Medications:  Current Outpatient Medications on File Prior to Visit  Medication Sig   Dimethyl Fumarate  240 MG CPDR Take 1 capsule by mouth twice daily   fluticasone  (FLONASE ) 50 MCG/ACT nasal spray Place 1 spray into both nostrils daily. (Patient not taking: Reported on 08/07/2024)   No current facility-administered medications on file prior to visit.    Allergies:  Allergies  Allergen Reactions   Promethazine Anxiety    Restless leg    Social History:  Social History   Socioeconomic History   Marital status: Married    Spouse name: Not on  file   Number of children: 2   Years of education: Not on file   Highest education level: Associate degree: academic program  Occupational History   Not on file  Tobacco Use   Smoking status: Never   Smokeless tobacco: Never  Vaping Use   Vaping status: Never Used  Substance and Sexual Activity   Alcohol use: Not Currently   Drug use: Never   Sexual activity: Not Currently  Other Topics Concern   Not on file  Social History Narrative   Are you right handed or left handed? Right Handed    Are you currently employed ? 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 No    Who lives with you? Husband - Ron    What type of home do you live in: 1 story or 2 story? Lives in a 3 story home       Social Drivers of Health   Financial Resource Strain: Low Risk  (04/03/2024)   Overall Financial Resource Strain (CARDIA)    Difficulty of Paying Living Expenses: Not hard at all  Food Insecurity: No Food Insecurity (04/03/2024)   Hunger Vital Sign    Worried About Running Out of Food in the Last Year: Never true    Ran  Out of Food in the Last Year: Never true  Transportation Needs: No Transportation Needs (04/03/2024)   PRAPARE - Administrator, Civil Service (Medical): No    Lack of Transportation (Non-Medical): No  Physical Activity: Insufficiently Active (04/03/2024)   Exercise Vital Sign    Days of Exercise per Week: 1 day    Minutes of Exercise per Session: 10 min  Stress: Stress Concern Present (04/03/2024)   [employer] of Occupational Health - Occupational Stress Questionnaire    Feeling of Stress: To some extent  Social Connections: Moderately Integrated (04/03/2024)   Social Connection and Isolation Panel    Frequency of Communication with Friends and Family: Twice a week    Frequency of Social Gatherings with Friends and Family: More than three times a week    Attends Religious Services: 1 to 4 times per year    Active Member of [employer] or Organizations: No    Attends Engineer, Structural: Not on file    Marital Status: Married  Catering Manager Violence: Not At Risk (12/08/2023)   Humiliation, Afraid, Rape, and Kick questionnaire    Fear of Current or Ex-Partner: No    Emotionally Abused: No    Physically Abused: No    Sexually Abused: No   Social History   Tobacco Use  Smoking Status Never  Smokeless Tobacco Never   Social History   Substance and Sexual Activity  Alcohol Use Not Currently    Family History:  Family History  Problem Relation Age of Onset   Healthy Mother    Cancer Father        prostate, leukemia   Diabetes Father    Intellectual disability Sister        down syndrome   Asthma Brother    ADD / ADHD Maternal Uncle    Breast cancer Maternal Grandmother    Cancer Maternal Grandmother        breast   ADD / ADHD Son     Past medical history, surgical history, medications, allergies, family history and social history reviewed with patient today and changes made to appropriate areas of the chart.  Review of Systems  Constitutional:  Negative.   HENT: Negative.    Eyes: Negative.   Respiratory: Negative.    Cardiovascular: Negative.   Gastrointestinal: Negative.   Genitourinary: Negative.   Musculoskeletal: Negative.   Skin: Negative.   Neurological: Negative.   Psychiatric/Behavioral: Negative.     All other ROS negative except what is listed above and in the HPI.      Objective:    BP 126/80 (BP Location: Right Arm, Patient Position: Sitting, Cuff Size: Large)   Pulse 62   Temp (!) 97 F (36.1 C)   Ht 5' 6 (1.676 m)   Wt 256 lb 6.4 oz (116.3 kg)   SpO2 99%   BMI 41.38 kg/m   Wt Readings from Last 3 Encounters:  08/07/24 256 lb 6.4 oz (116.3 kg)  04/04/24 276 lb 9.6 oz (125.5 kg)  01/05/24 279 lb (126.6 kg)    Physical Exam Vitals and nursing note reviewed.  Constitutional:      General: She is not in acute distress.    Appearance: Normal appearance.  HENT:     Head: Normocephalic and atraumatic.     Right Ear: Tympanic membrane, ear canal and external ear normal.     Left Ear: Tympanic membrane, ear canal and external ear normal.     Mouth/Throat:     Mouth: Mucous membranes are moist.     Pharynx: No posterior oropharyngeal erythema.  Eyes:     Conjunctiva/sclera: Conjunctivae normal.  Cardiovascular:     Rate and Rhythm: Normal rate and regular rhythm.     Pulses: Normal pulses.     Heart sounds: Normal heart sounds.  Pulmonary:     Effort: Pulmonary effort is normal.     Breath sounds: Normal breath sounds.  Abdominal:     Palpations: Abdomen is soft.     Tenderness: There is no abdominal tenderness.  Musculoskeletal:        General: Normal range of motion.     Cervical back: Normal range of motion and neck supple.     Right lower leg: No edema.     Left lower leg: No edema.  Lymphadenopathy:     Cervical: No cervical adenopathy.  Skin:    General: Skin is warm and dry.  Neurological:     General: No focal deficit present.     Mental Status: She is alert and oriented to  person, place, and time.     Cranial Nerves: No cranial nerve deficit.     Coordination: Coordination normal.     Gait: Gait normal.  Psychiatric:        Mood and Affect: Mood normal.        Behavior: Behavior normal.        Thought Content: Thought content normal.        Judgment: Judgment normal.     Results for orders placed or performed in visit on 12/27/23  Anti-MOG, Serum   Collection Time: 12/27/23 10:04 AM  Result Value Ref Range   MOG Antibody, Cell-based IFA Negative Negative  Angiotensin converting enzyme   Collection Time: 12/27/23 10:04 AM  Result Value Ref Range   Angio Convert Enzyme 22 14 - 82 U/L  ANCA Profile   Collection Time: 12/27/23 10:04 AM  Result Value Ref Range   Anti-MPO Antibodies <0.2 0.0 - 0.9 units   Anti-PR3 Antibodies <0.2 0.0 - 0.9 units   C-ANCA <1:20 Neg:<1:20 titer   P-ANCA <1:20 Neg:<1:20 titer   Atypical pANCA <  1:20 Neg:<1:20 titer      Assessment & Plan:   Problem List Items Addressed This Visit       Nervous and Auditory   MS (multiple sclerosis)   Chronic, stable. Symptoms have improved. She is taking Dimethyl Fumarate . Keep appointments with neurology.         Other   Anxiety and depression   Chronic, stable. Well controlled with lifestyle management and healthy eating. Follow-up with any concerns.       Morbid obesity (HCC)   BMI 41.3. She lost 25 pounds recently with high protein and low calories diet. Congratulated her on this and encouraged her to keep up the great work.       Routine general medical examination at a health care facility - Primary   Health maintenance reviewed and updated. Discussed nutrition, exercise. Check CMP, CBC today. Follow-up 1 year.        Prediabetes   Check A1c today and treat based on results.       Relevant Orders   CBC with Differential/Platelet   Comprehensive metabolic panel with GFR   Hemoglobin A1c   Other Visit Diagnoses       Screening for cardiovascular condition        Screen lipid panel today.   Relevant Orders   Lipid panel        Follow up plan: Return in about 1 year (around 08/07/2025) for CPE.   LABORATORY TESTING:  - Pap smear: up to date  IMMUNIZATIONS:   - Tdap: Tetanus vaccination status reviewed: last tetanus booster within 10 years. - Influenza: Declined - Pneumovax: Not applicable - Prevnar: Declined - HPV: Not applicable - Shingrix vaccine: Declined  SCREENING: -Mammogram: scheduled this month  - Colonoscopy: Declined  - Bone Density: Not applicable   PATIENT COUNSELING:   Advised to take 1 mg of folate supplement per day if capable of pregnancy.   Sexuality: Discussed sexually transmitted diseases, partner selection, use of condoms, avoidance of unintended pregnancy  and contraceptive alternatives.   Advised to avoid cigarette smoking.  I discussed with the patient that most people either abstain from alcohol or drink within safe limits (<=14/week and <=4 drinks/occasion for males, <=7/weeks and <= 3 drinks/occasion for females) and that the risk for alcohol disorders and other health effects rises proportionally with the number of drinks per week and how often a drinker exceeds daily limits.  Discussed cessation/primary prevention of drug use and availability of treatment for abuse.   Diet: Encouraged to adjust caloric intake to maintain  or achieve ideal body weight, to reduce intake of dietary saturated fat and total fat, to limit sodium intake by avoiding high sodium foods and not adding table salt, and to maintain adequate dietary potassium and calcium preferably from fresh fruits, vegetables, and low-fat dairy products.    stressed the importance of regular exercise  Injury prevention: Discussed safety belts, safety helmets, smoke detector, smoking near bedding or upholstery.   Dental health: Discussed importance of regular tooth brushing, flossing, and dental visits.    NEXT PREVENTATIVE PHYSICAL DUE IN 1  YEAR. Return in about 1 year (around 08/07/2025) for CPE.  Karrington Studnicka A Andrienne Havener

## 2024-08-07 NOTE — Assessment & Plan Note (Deleted)
 BMI 41.3. She lost 25 pounds recently with high protein and low calories diet. Congratulated her on this and encouraged her to keep up the great work.

## 2024-08-07 NOTE — Assessment & Plan Note (Signed)
 Chronic, stable. Well controlled with lifestyle management and healthy eating. Follow-up with any concerns.

## 2024-08-07 NOTE — Patient Instructions (Signed)
It was great to see you!  We are checking your labs today and will let you know the results via mychart/phone.   Keep up the great work!   Let's follow-up in 1 year, sooner if you have concerns.  If a referral was placed today, you will be contacted for an appointment. Please note that routine referrals can sometimes take up to 3-4 weeks to process. Please call our office if you haven't heard anything after this time frame.  Take care,  Nivin Braniff, NP  

## 2024-08-07 NOTE — Assessment & Plan Note (Signed)
 Chronic, stable. Symptoms have improved. She is taking Dimethyl Fumarate . Keep appointments with neurology.

## 2024-08-17 ENCOUNTER — Other Ambulatory Visit: Payer: Self-pay | Admitting: Nurse Practitioner

## 2024-08-17 DIAGNOSIS — Z1231 Encounter for screening mammogram for malignant neoplasm of breast: Secondary | ICD-10-CM

## 2024-08-21 ENCOUNTER — Inpatient Hospital Stay
Admission: RE | Admit: 2024-08-21 | Discharge: 2024-08-21 | Disposition: A | Source: Ambulatory Visit | Attending: Nurse Practitioner | Admitting: Nurse Practitioner

## 2024-08-21 DIAGNOSIS — Z1231 Encounter for screening mammogram for malignant neoplasm of breast: Secondary | ICD-10-CM

## 2024-08-22 ENCOUNTER — Ambulatory Visit: Admitting: Neurology

## 2024-08-22 ENCOUNTER — Encounter: Payer: Self-pay | Admitting: Neurology

## 2024-08-22 VITALS — BP 114/73 | HR 62 | Ht 66.0 in | Wt 254.5 lb

## 2024-08-22 DIAGNOSIS — H5509 Other forms of nystagmus: Secondary | ICD-10-CM | POA: Diagnosis not present

## 2024-08-22 DIAGNOSIS — Z79899 Other long term (current) drug therapy: Secondary | ICD-10-CM | POA: Diagnosis not present

## 2024-08-22 DIAGNOSIS — G35A Relapsing-remitting multiple sclerosis: Secondary | ICD-10-CM

## 2024-08-22 DIAGNOSIS — H532 Diplopia: Secondary | ICD-10-CM | POA: Diagnosis not present

## 2024-08-22 NOTE — Progress Notes (Signed)
 [facility]  PATIENT: Erika Mercer DOB: [birth year]  REFERRING DOCTOR OR PCP:  Tinnie Harada, NP SOURCE: Patient, notes from emergency room and hospital admission, imaging and lab reports, MRI images personally reviewed.  _________________________________   HISTORICAL  CHIEF COMPLAINT:  Chief Complaint  Patient presents with   Follow-up    Rm11, alone, Follow up for Demyelinating disease & Double vision    HISTORY OF PRESENT ILLNESS:  Erika Mercer is a 53 y.o. woman with relapsing remitting MS.  UPDATE 08/22/2024 Since her last visit we got the results of the CSF and she had the presence of oligoclonal bands in the CSF not present in the serum consistent with MS.  She started DMF and had no trouble tolerating it.  No flushing or GI issues.   She had a CBC/D and lymphocytes 08/07/2024 were 2.0.   She ran out and I will snd in a a new prescription  Currently, diplopia is doing better and only rare when she looks at TV and quickly looks away.  No nystagmus.  .   No longer needs prism  glasses..   Color vision is fine.  Balance is ok, she is using the bannister due to depth perception.   No facial or body numbness or paresthesia.     Bladder function is fine  No change in cognition.   She has had mild fatigue that is better.  Mood is fine.     MS history: She had the onset of dizziness, vertigo and diplopia on 11/18/2023 (noted upon wakening and persistent).  At the time she woke up with a room spinning sensation.  Symptoms were constant and worsened on peripheral gaze.  Diplopia was both horizontal and vertical.  A couple days later, she saw Urgent care and meclizine  was prescribed.   She saw her optometrist and diplopia was able to be corrected with prism  glasses.  She saw her primary care provider who obtained labs and ordered an MRI of the brain and referred her to neurology.  An MRI 12/05/2023 showed a small enhancing lesion in the posterior pons..  She saw Dr.  Tonita Blanch who advised her to return to the emergency room for lumbar puncture.   She was admitted and received IV solumedrol.  Lumbar puncture showed the presence of oligoclonal bands in the CSF not present in the serum.  Combined with the clinical and MRI data she met McDonald criteria for relapsing remitting MS diagnosis  No preceding viral syndrome or vaccination within a month of onset.     Imaging: MRI of the brain 12/05/2023 showed T2 hyperintense foci in the facial colliculus.  This enhanced after contrast (best seen on the sagittal images) additionally in the hemispheres there are couple scattered T2/FLAIR hyperintense foci in the subcortical or deep white matter.  None of these other foci enhance.  MRI of the cervical and thoracic spine 12/08/2023 showed a normal spinal cord and no significant degenerative change.   CSF 12/09/2023 showed elevated IgG index of 1.1.  Oligoclonal bands were present (11)   No FH of MS   No personal history of autoimmune disease     REVIEW OF SYSTEMS: Constitutional: No fevers, chills, sweats, or change in appetite Eyes: No visual changes, double vision, eye pain Ear, nose and throat: No hearing loss, ear pain, nasal congestion, sore throat Cardiovascular: No chest pain, palpitations Respiratory:  No shortness of breath at rest or with exertion.   No wheezes GastrointestinaI: No nausea, vomiting, diarrhea, abdominal pain,  fecal incontinence Genitourinary:  No dysuria, urinary retention or frequency.  No nocturia. Musculoskeletal:  No neck pain, back pain Integumentary: No rash, pruritus, skin lesions Neurological: as above Psychiatric: No depression at this time.  No anxiety Endocrine: No palpitations, diaphoresis, change in appetite, change in weigh or increased thirst Hematologic/Lymphatic:  No anemia, purpura, petechiae. Allergic/Immunologic: No itchy/runny eyes, nasal congestion, recent allergic reactions, rashes  ALLERGIES: Allergies   Allergen Reactions   Promethazine Anxiety    Restless leg    HOME MEDICATIONS:  Current Outpatient Medications:    Dimethyl Fumarate  240 MG CPDR, Take 1 capsule by mouth twice daily, Disp: 180 capsule, Rfl: 3  PAST MEDICAL HISTORY: Past Medical History:  Diagnosis Date   Anxiety    Atrial fibrillation (HCC)    Depression    MS (multiple sclerosis)     PAST SURGICAL HISTORY: Past Surgical History:  Procedure Laterality Date   CESAREAN SECTION  03/03/1995   x2    FAMILY HISTORY: Family History  Problem Relation Age of Onset   Healthy Mother    Cancer Father        prostate, leukemia   Diabetes Father    Intellectual disability Sister        down syndrome   Asthma Brother    ADD / ADHD Maternal Uncle    Breast cancer Maternal Grandmother    Cancer Maternal Grandmother        breast   ADD / ADHD Son     SOCIAL HISTORY: Social History   Socioeconomic History   Marital status: Married    Spouse name: Not on file   Number of children: 2   Years of education: Not on file   Highest education level: Associate degree: academic program  Occupational History   Not on file  Tobacco Use   Smoking status: Never   Smokeless tobacco: Never  Vaping Use   Vaping status: Never Used  Substance and Sexual Activity   Alcohol use: Not Currently   Drug use: Never   Sexual activity: Not Currently  Other Topics Concern   Not on file  Social History Narrative   Are you right handed or left handed? Right Handed    Are you currently employed ? 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 No    Who lives with you? Husband - Ron    What type of home do you live in: 1 story or 2 story? Lives in a 3 story home       Social Drivers of Health   Tobacco Use: Low Risk (08/22/2024)   Patient History    Smoking Tobacco Use: Never    Smokeless Tobacco Use: Never    Passive Exposure: Not on file  Financial Resource Strain: Low Risk  (04/03/2024)   Overall Financial Resource Strain (CARDIA)    Difficulty of Paying Living Expenses: Not hard at all  Food Insecurity: No Food Insecurity (04/03/2024)   Epic    Worried About Radiation Protection Practitioner of Food in the Last Year: Never true    Ran Out of Food in the Last Year: Never true  Transportation Needs: No Transportation Needs (04/03/2024)   Epic    Lack of Transportation (Medical): No    Lack of Transportation (Non-Medical): No  Physical Activity: Insufficiently Active (04/03/2024)   Exercise Vital Sign    Days of Exercise per Week: 1 day    Minutes of Exercise per  Session: 10 min  Stress: Stress Concern Present (04/03/2024)   [employer] of Occupational Health - Occupational Stress Questionnaire    Feeling of Stress: To some extent  Social Connections: Moderately Integrated (04/03/2024)   Social Connection and Isolation Panel    Frequency of Communication with Friends and Family: Twice a week    Frequency of Social Gatherings with Friends and Family: More than three times a week    Attends Religious Services: 1 to 4 times per year    Active Member of [employer] or Organizations: No    Attends Banker Meetings: Not on file    Marital Status: Married  Catering Manager Violence: Not At Risk (12/08/2023)   Humiliation, Afraid, Rape, and Kick questionnaire    Fear of Current or Ex-Partner: No    Emotionally Abused: No    Physically Abused: No    Sexually Abused: No  Depression (PHQ2-9): Low Risk (08/07/2024)   Depression (PHQ2-9)    PHQ-2 Score: 0  Alcohol Screen: Low Risk (11/30/2023)   Alcohol Screen    Last Alcohol Screening Score (AUDIT): 0  Housing: Low Risk (04/03/2024)   Epic    Unable to Pay for Housing in the Last Year: No    Number of Times Moved in the Last Year: 0    Homeless in the Last Year: No  Utilities: Not At Risk (12/08/2023)   [employer] Utilities    Threatened with loss of utilities: No  Health Literacy: Not on file       PHYSICAL EXAM  Vitals:    08/22/24 1556  BP: 114/73  Pulse: 62  Weight: 254 lb 8 oz (115.4 kg)  Height: 5' 6 (1.676 m)    Body mass index is 41.08 kg/m.   No results found.   General: The patient is well-developed and well-nourished and in no acute distress  HEENT:  Head is [place]/AT.  Sclera are anicteric.  Skin: Extremities are without rash or  edema.  Neurologic Exam  Mental status: The patient is alert and oriented x 3 at the time of the examination. The patient has apparent normal recent and remote memory, with an apparently normal attention span and concentration ability.   Speech is normal.  Cranial nerves: She has no diplopia.  No disconjugate gaze was noted.. Pupils are equal, round, and reactive to light and accomodation.  Visual fields are full.  Facial symmetry is present. There is good facial sensation to soft touch bilaterally.Facial strength is normal.  Trapezius and sternocleidomastoid strength is normal. No dysarthria is noted.   No obvious hearing deficits are noted.  Motor:  Muscle bulk is normal.   Tone is normal. Strength is  5 / 5 in all 4 extremities.   Sensory: Sensory testing is intact to pinprick, soft touch and vibration sensation in all 4 extremities.  Coordination: Cerebellar testing reveals good finger-nose-finger and heel-to-shin bilaterally.  Gait and station: Station is normal.   Gait is normal. Tandem gait is normal. Romberg is negative.   Reflexes: Deep tendon reflexes are symmetric and normal bilaterally.   Plantar responses are flexor.    DIAGNOSTIC DATA (LABS, IMAGING, TESTING) - I reviewed patient records, labs, notes, testing and imaging myself where available.  Lab Results  Component Value Date   WBC 6.7 08/07/2024   HGB 14.1 08/07/2024   HCT 42.3 08/07/2024   MCV 88.8 08/07/2024   PLT 307.0 08/07/2024      Component Value Date/Time   NA 140 08/07/2024 0931  K 4.9 08/07/2024 0931   CL 105 08/07/2024 0931   CO2 29 08/07/2024 0931   GLUCOSE 103 (H)  08/07/2024 0931   BUN 12 08/07/2024 0931   CREATININE 0.60 08/07/2024 0931   CALCIUM 9.6 08/07/2024 0931   PROT 6.9 08/07/2024 0931   ALBUMIN 4.4 08/07/2024 0931   ALBUMIN 4.3 12/09/2023 0950   AST 18 08/07/2024 0931   ALT 22 08/07/2024 0931   ALKPHOS 42 08/07/2024 0931   BILITOT 0.5 08/07/2024 0931   GFRNONAA >60 12/11/2023 0605   Lab Results  Component Value Date   CHOL 162 08/07/2024   HDL 52.60 08/07/2024   LDLCALC 95 08/07/2024   TRIG 69.0 08/07/2024   CHOLHDL 3 08/07/2024   Lab Results  Component Value Date   HGBA1C 5.5 08/07/2024   No results found for: VITAMINB12 Lab Results  Component Value Date   TSH 4.155 12/08/2023       ASSESSMENT AND PLAN  Multiple sclerosis, relapsing-remitting - Plan: MR BRAIN W WO CONTRAST  Double vision  High risk medication use  Horizontal nystagmus  Continue dimethyl fumarate  240 mg twice a day We will check an MRI of the brain to determine if there is any breakthrough activity.  If present, consider different disease modifying therapy. Stay active and exercise as tolerated. Return in 6 or 7 months or sooner if there are new or worsening neurologic symptoms.     Delisa Finck A. Vear, MD, [facility] 08/22/2024, 4:40 PM Certified in Neurology, Clinical Neurophysiology, Sleep Medicine and Neuroimaging  [facility] [address] [place], [place] [address] [phone]

## 2024-08-23 ENCOUNTER — Telehealth: Payer: Self-pay | Admitting: Neurology

## 2024-08-23 NOTE — Telephone Encounter (Signed)
MRI order sent to [place] [phone]

## 2024-08-25 ENCOUNTER — Ambulatory Visit: Payer: Self-pay | Admitting: Nurse Practitioner

## 2024-09-21 ENCOUNTER — Encounter: Payer: Self-pay | Admitting: Neurology

## 2024-09-29 ENCOUNTER — Ambulatory Visit
Admission: RE | Admit: 2024-09-29 | Discharge: 2024-09-29 | Disposition: A | Source: Ambulatory Visit | Attending: Neurology | Admitting: Neurology

## 2024-09-29 DIAGNOSIS — G35A Relapsing-remitting multiple sclerosis: Secondary | ICD-10-CM | POA: Diagnosis not present

## 2024-09-29 MED ORDER — GADOPICLENOL 0.5 MMOL/ML IV SOLN
10.0000 mL | Freq: Once | INTRAVENOUS | Status: AC | PRN
  Administered 2024-09-29: 10 mL via INTRAVENOUS

## 2024-10-02 ENCOUNTER — Ambulatory Visit: Payer: Self-pay | Admitting: Neurology

## 2025-04-17 ENCOUNTER — Ambulatory Visit: Admitting: Neurology

## 2025-08-08 ENCOUNTER — Encounter: Admitting: Nurse Practitioner
